# Patient Record
Sex: Female | Born: 1935
Health system: Southern US, Community
[De-identification: ages and names within clinical notes are randomized; demographics above are authoritative.]

## PROBLEM LIST (undated history)

## (undated) DIAGNOSIS — I10 Essential (primary) hypertension: Secondary | ICD-10-CM

## (undated) DIAGNOSIS — E079 Disorder of thyroid, unspecified: Secondary | ICD-10-CM

## (undated) HISTORY — PX: ABDOMINAL HYSTERECTOMY: SHX81

## (undated) HISTORY — PX: BREAST EXCISIONAL BIOPSY: SUR124

---

## 1999-12-31 ENCOUNTER — Encounter: Admission: RE | Admit: 1999-12-31 | Discharge: 1999-12-31 | Payer: Self-pay | Admitting: Internal Medicine

## 1999-12-31 ENCOUNTER — Encounter: Payer: Self-pay | Admitting: Internal Medicine

## 2000-12-11 ENCOUNTER — Encounter: Payer: Self-pay | Admitting: Internal Medicine

## 2000-12-11 ENCOUNTER — Encounter: Admission: RE | Admit: 2000-12-11 | Discharge: 2000-12-11 | Payer: Self-pay | Admitting: Internal Medicine

## 2000-12-21 ENCOUNTER — Other Ambulatory Visit: Admission: RE | Admit: 2000-12-21 | Discharge: 2000-12-21 | Payer: Self-pay | Admitting: Internal Medicine

## 2001-12-13 ENCOUNTER — Encounter: Payer: Self-pay | Admitting: Internal Medicine

## 2001-12-13 ENCOUNTER — Encounter: Admission: RE | Admit: 2001-12-13 | Discharge: 2001-12-13 | Payer: Self-pay | Admitting: Internal Medicine

## 2002-12-17 ENCOUNTER — Encounter: Admission: RE | Admit: 2002-12-17 | Discharge: 2002-12-17 | Payer: Self-pay | Admitting: Internal Medicine

## 2002-12-17 ENCOUNTER — Encounter: Payer: Self-pay | Admitting: Internal Medicine

## 2003-04-19 ENCOUNTER — Emergency Department (HOSPITAL_COMMUNITY): Admission: EM | Admit: 2003-04-19 | Discharge: 2003-04-19 | Payer: Self-pay | Admitting: Emergency Medicine

## 2003-09-08 ENCOUNTER — Encounter (INDEPENDENT_AMBULATORY_CARE_PROVIDER_SITE_OTHER): Payer: Self-pay | Admitting: Specialist

## 2003-09-08 ENCOUNTER — Ambulatory Visit (HOSPITAL_COMMUNITY): Admission: RE | Admit: 2003-09-08 | Discharge: 2003-09-08 | Payer: Self-pay | Admitting: *Deleted

## 2004-03-19 ENCOUNTER — Ambulatory Visit (HOSPITAL_COMMUNITY): Admission: RE | Admit: 2004-03-19 | Discharge: 2004-03-19 | Payer: Self-pay | Admitting: Internal Medicine

## 2005-03-28 ENCOUNTER — Ambulatory Visit (HOSPITAL_COMMUNITY): Admission: RE | Admit: 2005-03-28 | Discharge: 2005-03-28 | Payer: Self-pay | Admitting: Internal Medicine

## 2006-04-11 ENCOUNTER — Ambulatory Visit (HOSPITAL_COMMUNITY): Admission: RE | Admit: 2006-04-11 | Discharge: 2006-04-11 | Payer: Self-pay | Admitting: Internal Medicine

## 2007-04-16 ENCOUNTER — Ambulatory Visit (HOSPITAL_COMMUNITY): Admission: RE | Admit: 2007-04-16 | Discharge: 2007-04-16 | Payer: Self-pay | Admitting: Internal Medicine

## 2007-04-20 ENCOUNTER — Encounter: Admission: RE | Admit: 2007-04-20 | Discharge: 2007-04-20 | Payer: Self-pay | Admitting: Internal Medicine

## 2007-04-30 ENCOUNTER — Encounter: Admission: RE | Admit: 2007-04-30 | Discharge: 2007-04-30 | Payer: Self-pay | Admitting: Internal Medicine

## 2008-04-21 ENCOUNTER — Ambulatory Visit (HOSPITAL_COMMUNITY): Admission: RE | Admit: 2008-04-21 | Discharge: 2008-04-21 | Payer: Self-pay | Admitting: Internal Medicine

## 2008-12-01 IMAGING — US US EXTREM LOW VENOUS*L*
1 series · 14 of 24 positions shown · non-contrast
Comparison: None.

CLINICAL DATA: Left leg pain and swelling.  Question DVT.
 LEFT LOWER EXTREMITY VENOUS DOPPLER ULTRASOUND:
TECHNIQUE: Gray-scale sonography with compression as well as color and duplex Doppler ultrasound were performed to evaluate the deep venous system from the level of the common femoral vein through the popliteal and proximal calf veins.

[Series 1: unknown · 14 of 25 slices shown]
[im 1/25]
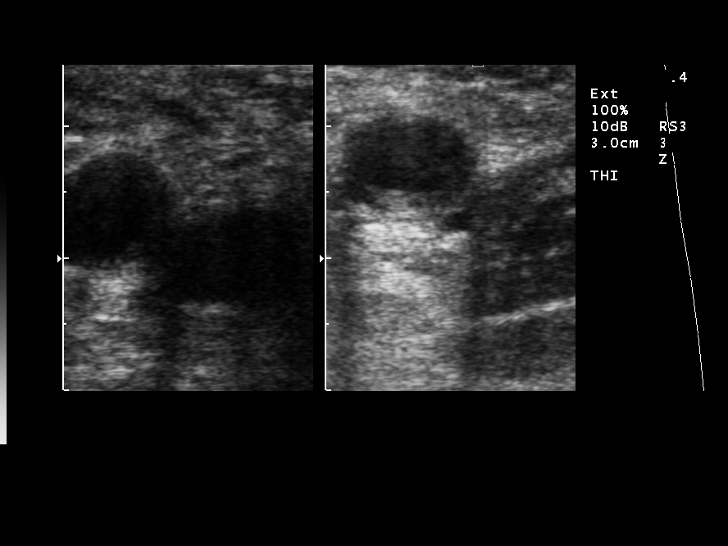
[im 3/25]
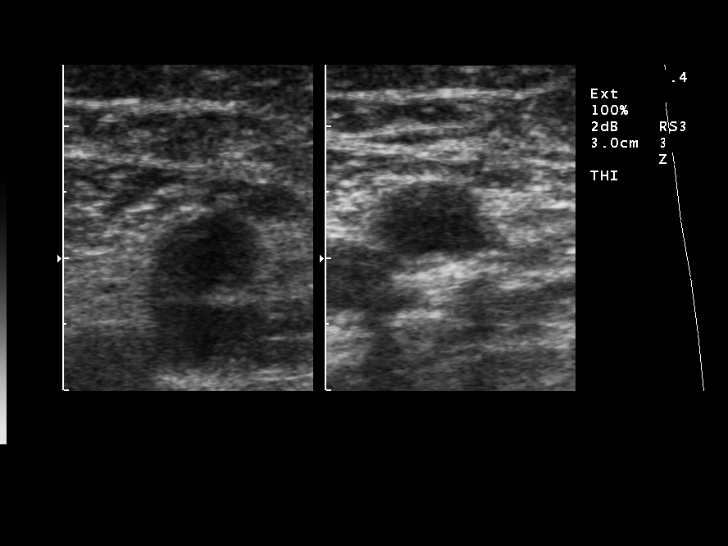
[im 5/25]
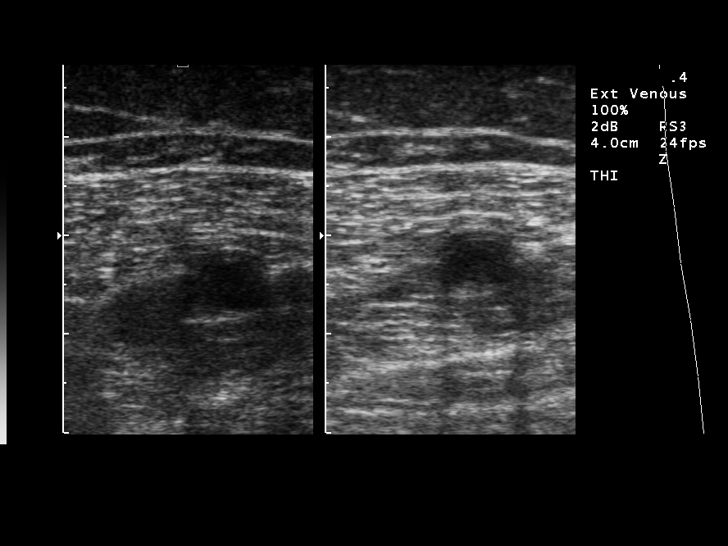
[im 7/25]
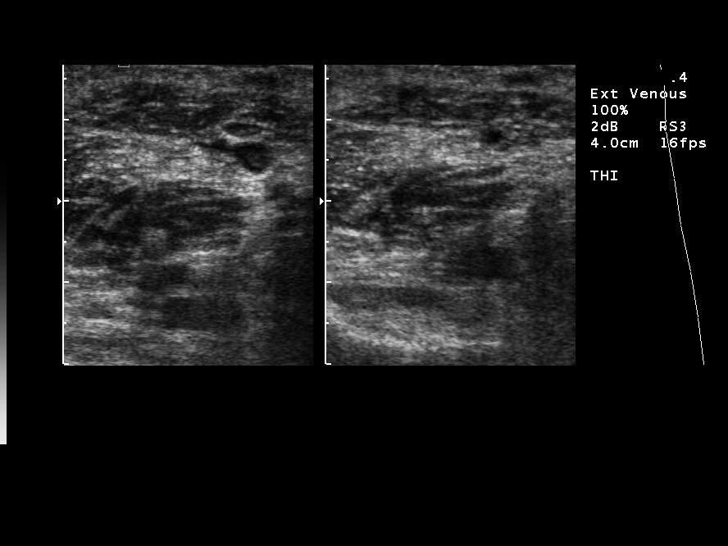
[im 8/25]
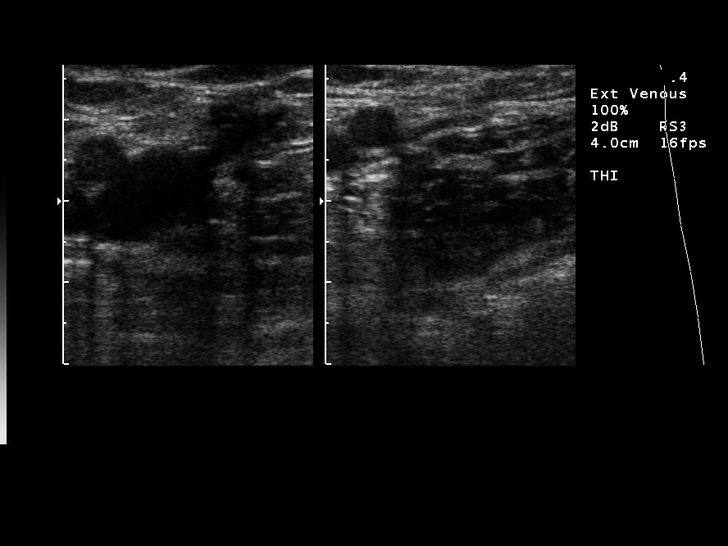
[im 10/25]
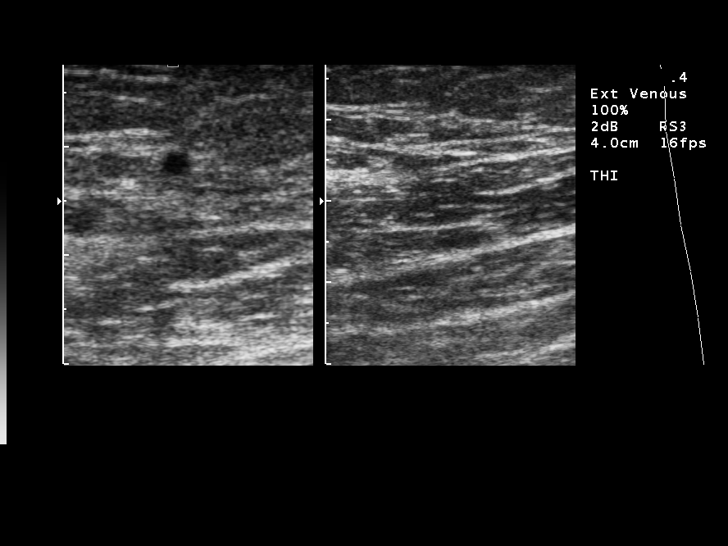
[im 12/25]
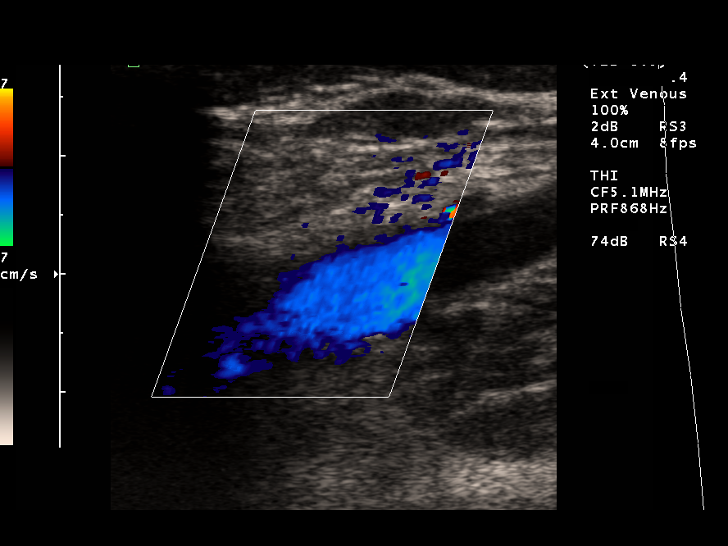
[im 13/25]
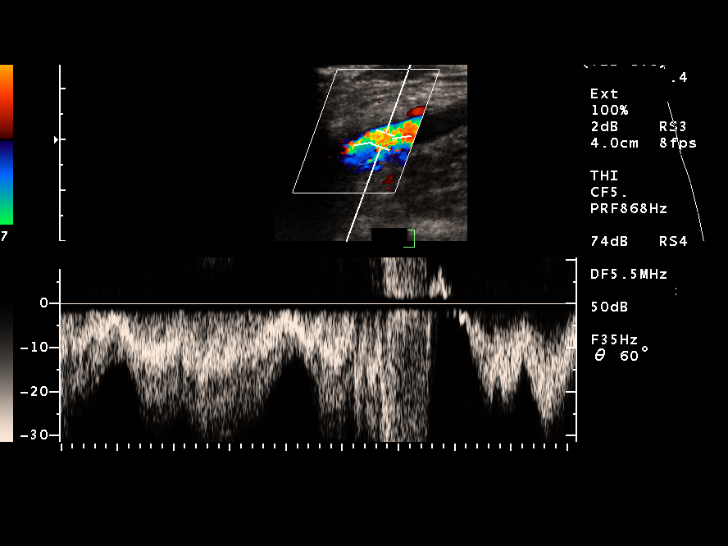
[im 15/25]
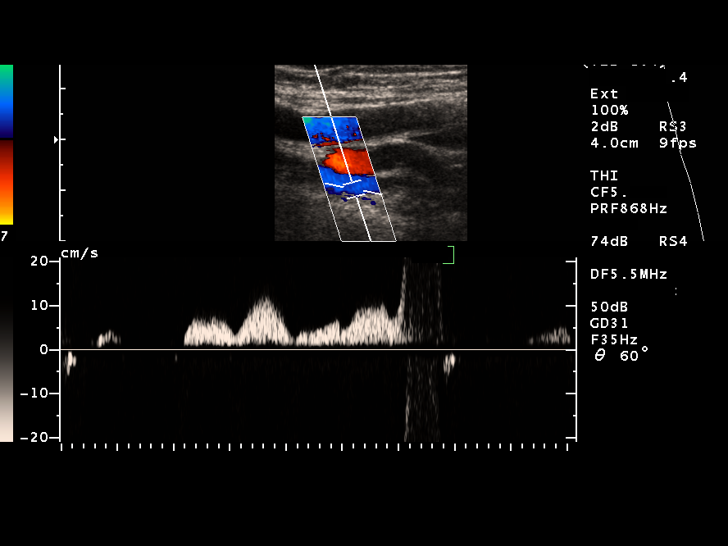
[im 17/25]
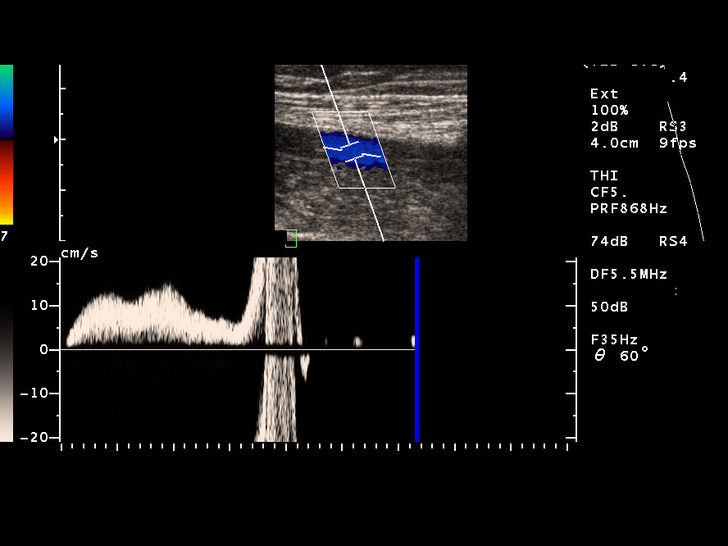
[im 19/25]
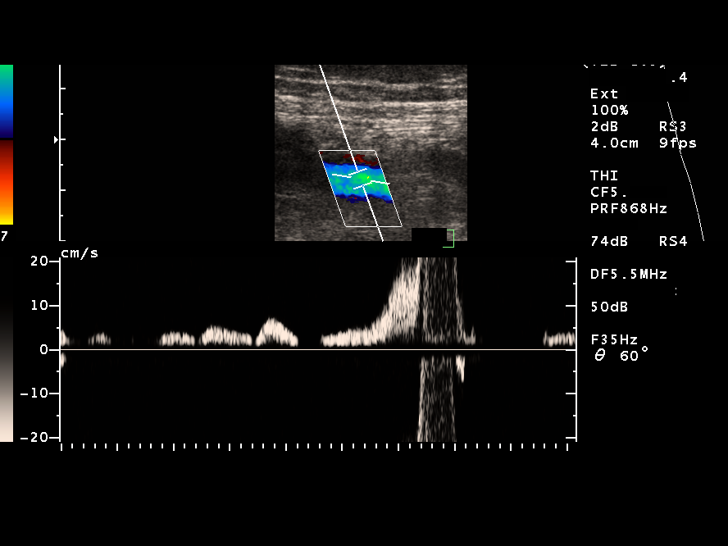
[im 20/25]
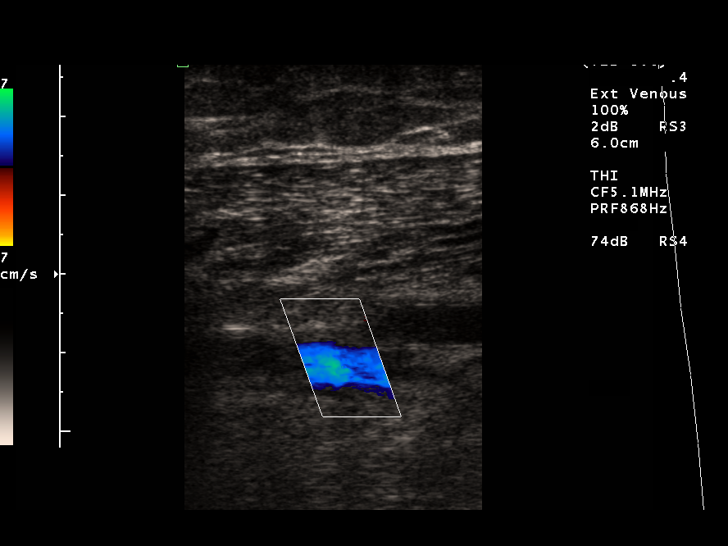
[im 22/25]
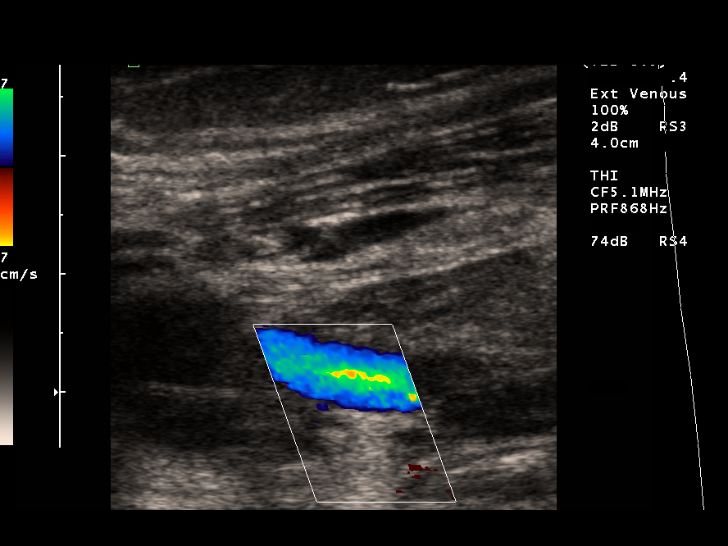
[im 25/25]
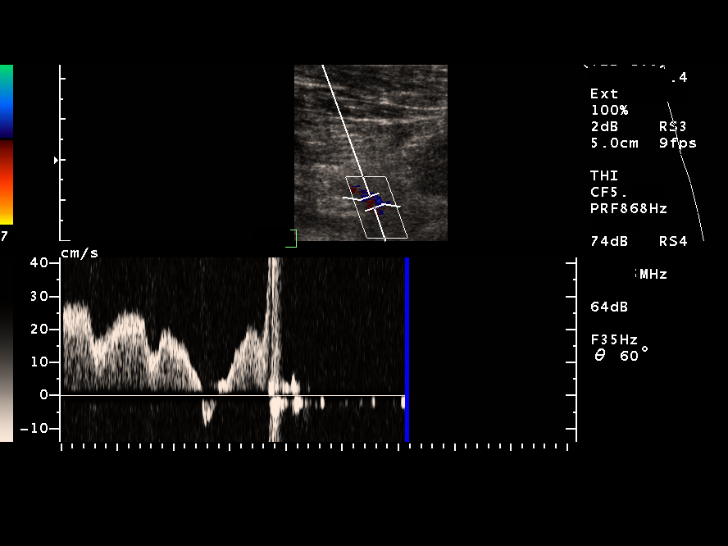

[14 of 24 positions shown; findings below may reference images not displayed]

FINDINGS: There is normal flow, compressibility, and augmentation in the left common femoral, superficial femoral, and popliteal veins.
IMPRESSION: No DVT.

## 2009-01-10 ENCOUNTER — Emergency Department (HOSPITAL_COMMUNITY): Admission: EM | Admit: 2009-01-10 | Discharge: 2009-01-10 | Payer: Self-pay | Admitting: Emergency Medicine

## 2009-04-22 ENCOUNTER — Ambulatory Visit (HOSPITAL_COMMUNITY): Admission: RE | Admit: 2009-04-22 | Discharge: 2009-04-22 | Payer: Self-pay | Admitting: Internal Medicine

## 2010-04-23 ENCOUNTER — Ambulatory Visit (HOSPITAL_COMMUNITY)
Admission: RE | Admit: 2010-04-23 | Discharge: 2010-04-23 | Payer: Self-pay | Source: Home / Self Care | Admitting: Internal Medicine

## 2010-06-27 ENCOUNTER — Encounter: Payer: Self-pay | Admitting: Internal Medicine

## 2010-10-22 NOTE — Op Note (Signed)
NAME:  Christy Buck, Christy Buck                        ACCOUNT NO.:  [id number]   MEDICAL RECORD NO.:  [id number]                   PATIENT TYPE:  AMB   LOCATION:  ENDO                                 FACILITY:  [facility]   PHYSICIAN:  Georgiana Spinner, M.D.                 DATE OF BIRTH:  1929-05-10   DATE OF PROCEDURE:  DATE OF DISCHARGE:                                 OPERATIVE REPORT   PROCEDURE:  Upper endoscopy.   INDICATIONS:  GERD.   ANESTHESIA:  Demerol 60 mg, Versed 5 mg.   DESCRIPTION OF PROCEDURE:  With the patient mildly sedated in the left  lateral decubitus position, the Olympus videoscopic endoscope was inserted  in the mouth and passed under direct vision through the esophagus into the  stomach, and through the pylorus to the second portion of the duodenum which  was photographed.  From this point, the endoscope was slowly withdrawn  taking circumferential views of the duodenal mucosa, stopping in the  duodenal bulb and photographed that, what appeared to be a normal bulb, and  in the antrum where diffuse mild erythema was seen and photographed and  biopsied.  Endoscope was then placed in retroflexion and viewed the stomach  from below.  The endoscope was then straightened, withdrawn, taking  circumferential views of the remaining gastric and esophageal mucosa.  The  patient's vital signs and pulse oximetry remained stable.  The patient  tolerated the procedure well without apparent complication.   FINDINGS:  Erythema in the antrum; otherwise unremarkable exam.   PLAN:  Await biopsy report.  The patient will call me for results and follow  up with me as an outpatient.  Proceed to colonoscopy as planned.                                               Georgiana Spinner, M.D.    GMO/MEDQ  D:  09/08/2003  T:  09/08/2003  Job:  [id number]

## 2010-10-22 NOTE — Op Note (Signed)
NAME:  Christy Buck, Christy Buck                        ACCOUNT NO.:  [id number]   MEDICAL RECORD NO.:  [id number]                   PATIENT TYPE:  AMB   LOCATION:  ENDO                                 FACILITY:  [facility]   PHYSICIAN:  Georgiana Spinner, M.D.                 DATE OF BIRTH:  [birth year]   DATE OF PROCEDURE:  09/08/2003  DATE OF DISCHARGE:                                 OPERATIVE REPORT   PROCEDURE:  Colonoscopy.   INDICATIONS:  Colon polyps.   ANESTHESIA:  1. Demerol 20 m  2. Versed 2 mg.   DESCRIPTION OF PROCEDURE:  With the patient mildly sedated in the left  lateral decubitus position, the Olympus videoscopic colonoscope was inserted  into the rectum  and passed under direct vision to the cecum -- identified  by the ileocecal valve and appendiceal orifice, both of which are  photographed.  From this point the colonoscope was slowly withdrawn, taking  circumferential views of the remaining colonic mucosa and stopping at  approximately 25 cm from the anal verge.  At this point a poly was seen,  photographed, and removed using snare cautery technique with setting of  20/20 blended current.  The tissue was retrieved for pathology.  The  endoscope was withdrawn through the rectum, which appeared normal in direct  and showed hemorrhoids in retroflexed view.  The endoscope was then  straightened and withdrawn.  The patient's vital signs and pulse oximetry  remained stable.  The patient tolerated the procedure well and all without  apparent complications.   FINDINGS:  1. Polyp at 25 cm.  2. Internal hemorrhoids.  3. Otherwise unremarkable examination.   PLAN:  Await biopsy report.  The patient will call me for results and follow  up with me as an outpatient.                                               Georgiana Spinner, M.D.    GMO/MEDQ  D:  09/08/2003  T:  09/08/2003  Job:  [id number]

## 2011-05-03 ENCOUNTER — Other Ambulatory Visit (HOSPITAL_COMMUNITY): Payer: Self-pay | Admitting: Internal Medicine

## 2011-05-03 DIAGNOSIS — Z1231 Encounter for screening mammogram for malignant neoplasm of breast: Secondary | ICD-10-CM

## 2011-06-09 ENCOUNTER — Ambulatory Visit (HOSPITAL_COMMUNITY)
Admission: RE | Admit: 2011-06-09 | Discharge: 2011-06-09 | Disposition: A | Discharge status: Home / Self Care | Payer: Medicare Other | Source: Ambulatory Visit | Attending: Internal Medicine | Admitting: Internal Medicine

## 2011-06-09 DIAGNOSIS — Z1231 Encounter for screening mammogram for malignant neoplasm of breast: Secondary | ICD-10-CM | POA: Diagnosis not present

## 2011-06-16 ENCOUNTER — Other Ambulatory Visit: Payer: Self-pay | Admitting: Internal Medicine

## 2011-06-16 DIAGNOSIS — R928 Other abnormal and inconclusive findings on diagnostic imaging of breast: Secondary | ICD-10-CM

## 2011-06-17 DIAGNOSIS — L03039 Cellulitis of unspecified toe: Secondary | ICD-10-CM | POA: Diagnosis not present

## 2011-06-22 DIAGNOSIS — E039 Hypothyroidism, unspecified: Secondary | ICD-10-CM | POA: Diagnosis not present

## 2011-06-22 DIAGNOSIS — I10 Essential (primary) hypertension: Secondary | ICD-10-CM | POA: Diagnosis not present

## 2011-06-22 DIAGNOSIS — M159 Polyosteoarthritis, unspecified: Secondary | ICD-10-CM | POA: Diagnosis not present

## 2011-06-22 DIAGNOSIS — R5381 Other malaise: Secondary | ICD-10-CM | POA: Diagnosis not present

## 2011-06-22 DIAGNOSIS — R5383 Other fatigue: Secondary | ICD-10-CM | POA: Diagnosis not present

## 2011-06-24 ENCOUNTER — Ambulatory Visit
Admission: RE | Admit: 2011-06-24 | Discharge: 2011-06-24 | Disposition: A | Discharge status: Home / Self Care | Payer: Medicare Other | Source: Ambulatory Visit | Attending: Internal Medicine | Admitting: Internal Medicine

## 2011-06-24 ENCOUNTER — Ambulatory Visit
Admission: RE | Admit: 2011-06-24 | Discharge: 2011-06-24 | Disposition: A | Discharge status: Home / Self Care | Payer: Federal, State, Local not specified - PPO | Source: Ambulatory Visit | Attending: Internal Medicine | Admitting: Internal Medicine

## 2011-06-24 DIAGNOSIS — R928 Other abnormal and inconclusive findings on diagnostic imaging of breast: Secondary | ICD-10-CM

## 2011-06-24 DIAGNOSIS — N6009 Solitary cyst of unspecified breast: Secondary | ICD-10-CM | POA: Diagnosis not present

## 2011-07-13 DIAGNOSIS — H524 Presbyopia: Secondary | ICD-10-CM | POA: Diagnosis not present

## 2011-07-13 DIAGNOSIS — H04129 Dry eye syndrome of unspecified lacrimal gland: Secondary | ICD-10-CM | POA: Diagnosis not present

## 2011-07-13 DIAGNOSIS — H1045 Other chronic allergic conjunctivitis: Secondary | ICD-10-CM | POA: Diagnosis not present

## 2011-07-13 DIAGNOSIS — H409 Unspecified glaucoma: Secondary | ICD-10-CM | POA: Diagnosis not present

## 2011-07-13 DIAGNOSIS — H4011X0 Primary open-angle glaucoma, stage unspecified: Secondary | ICD-10-CM | POA: Diagnosis not present

## 2011-09-06 DIAGNOSIS — L84 Corns and callosities: Secondary | ICD-10-CM | POA: Diagnosis not present

## 2011-12-09 DIAGNOSIS — H4011X0 Primary open-angle glaucoma, stage unspecified: Secondary | ICD-10-CM | POA: Diagnosis not present

## 2011-12-09 DIAGNOSIS — H2510 Age-related nuclear cataract, unspecified eye: Secondary | ICD-10-CM | POA: Diagnosis not present

## 2011-12-09 DIAGNOSIS — H409 Unspecified glaucoma: Secondary | ICD-10-CM | POA: Diagnosis not present

## 2011-12-09 DIAGNOSIS — H40039 Anatomical narrow angle, unspecified eye: Secondary | ICD-10-CM | POA: Diagnosis not present

## 2011-12-09 DIAGNOSIS — H40019 Open angle with borderline findings, low risk, unspecified eye: Secondary | ICD-10-CM | POA: Diagnosis not present

## 2011-12-12 ENCOUNTER — Other Ambulatory Visit: Payer: Self-pay | Admitting: Internal Medicine

## 2011-12-12 DIAGNOSIS — N63 Unspecified lump in unspecified breast: Secondary | ICD-10-CM

## 2011-12-16 ENCOUNTER — Ambulatory Visit
Admission: RE | Admit: 2011-12-16 | Discharge: 2011-12-16 | Disposition: A | Discharge status: Home / Self Care | Payer: Medicare Other | Source: Ambulatory Visit | Attending: Internal Medicine | Admitting: Internal Medicine

## 2011-12-16 DIAGNOSIS — N63 Unspecified lump in unspecified breast: Secondary | ICD-10-CM

## 2011-12-16 DIAGNOSIS — R928 Other abnormal and inconclusive findings on diagnostic imaging of breast: Secondary | ICD-10-CM | POA: Diagnosis not present

## 2011-12-21 DIAGNOSIS — E039 Hypothyroidism, unspecified: Secondary | ICD-10-CM | POA: Diagnosis not present

## 2011-12-21 DIAGNOSIS — Z0000 Encounter for general adult medical examination without abnormal findings: Secondary | ICD-10-CM | POA: Diagnosis not present

## 2011-12-21 DIAGNOSIS — I10 Essential (primary) hypertension: Secondary | ICD-10-CM | POA: Diagnosis not present

## 2011-12-21 DIAGNOSIS — M159 Polyosteoarthritis, unspecified: Secondary | ICD-10-CM | POA: Diagnosis not present

## 2011-12-21 DIAGNOSIS — R5381 Other malaise: Secondary | ICD-10-CM | POA: Diagnosis not present

## 2011-12-28 DIAGNOSIS — E039 Hypothyroidism, unspecified: Secondary | ICD-10-CM | POA: Diagnosis not present

## 2011-12-28 DIAGNOSIS — R5383 Other fatigue: Secondary | ICD-10-CM | POA: Diagnosis not present

## 2011-12-28 DIAGNOSIS — I10 Essential (primary) hypertension: Secondary | ICD-10-CM | POA: Diagnosis not present

## 2011-12-28 DIAGNOSIS — H908 Mixed conductive and sensorineural hearing loss, unspecified: Secondary | ICD-10-CM | POA: Diagnosis not present

## 2011-12-28 DIAGNOSIS — M159 Polyosteoarthritis, unspecified: Secondary | ICD-10-CM | POA: Diagnosis not present

## 2012-03-12 DIAGNOSIS — H402290 Chronic angle-closure glaucoma, unspecified eye, stage unspecified: Secondary | ICD-10-CM | POA: Diagnosis not present

## 2012-03-12 DIAGNOSIS — H4011X0 Primary open-angle glaucoma, stage unspecified: Secondary | ICD-10-CM | POA: Diagnosis not present

## 2012-03-12 DIAGNOSIS — H409 Unspecified glaucoma: Secondary | ICD-10-CM | POA: Diagnosis not present

## 2012-03-12 DIAGNOSIS — H04129 Dry eye syndrome of unspecified lacrimal gland: Secondary | ICD-10-CM | POA: Diagnosis not present

## 2012-03-23 DIAGNOSIS — H409 Unspecified glaucoma: Secondary | ICD-10-CM | POA: Diagnosis not present

## 2012-03-23 DIAGNOSIS — H2510 Age-related nuclear cataract, unspecified eye: Secondary | ICD-10-CM | POA: Diagnosis not present

## 2012-03-23 DIAGNOSIS — H402290 Chronic angle-closure glaucoma, unspecified eye, stage unspecified: Secondary | ICD-10-CM | POA: Diagnosis not present

## 2012-03-30 DIAGNOSIS — Z23 Encounter for immunization: Secondary | ICD-10-CM | POA: Diagnosis not present

## 2012-04-12 DIAGNOSIS — H409 Unspecified glaucoma: Secondary | ICD-10-CM | POA: Diagnosis not present

## 2012-04-12 DIAGNOSIS — H402290 Chronic angle-closure glaucoma, unspecified eye, stage unspecified: Secondary | ICD-10-CM | POA: Diagnosis not present

## 2012-05-10 DIAGNOSIS — H409 Unspecified glaucoma: Secondary | ICD-10-CM | POA: Diagnosis not present

## 2012-05-10 DIAGNOSIS — H402290 Chronic angle-closure glaucoma, unspecified eye, stage unspecified: Secondary | ICD-10-CM | POA: Diagnosis not present

## 2012-06-13 ENCOUNTER — Other Ambulatory Visit: Payer: Self-pay | Admitting: Internal Medicine

## 2012-06-13 DIAGNOSIS — N63 Unspecified lump in unspecified breast: Secondary | ICD-10-CM

## 2012-06-22 ENCOUNTER — Other Ambulatory Visit: Payer: Self-pay | Admitting: Internal Medicine

## 2012-06-22 ENCOUNTER — Ambulatory Visit
Admission: RE | Admit: 2012-06-22 | Discharge: 2012-06-22 | Disposition: A | Discharge status: Home / Self Care | Payer: Federal, State, Local not specified - PPO | Source: Ambulatory Visit | Attending: Internal Medicine | Admitting: Internal Medicine

## 2012-06-22 ENCOUNTER — Ambulatory Visit
Admission: RE | Admit: 2012-06-22 | Discharge: 2012-06-22 | Disposition: A | Discharge status: Home / Self Care | Payer: Medicare Other | Source: Ambulatory Visit | Attending: Internal Medicine | Admitting: Internal Medicine

## 2012-06-22 DIAGNOSIS — N63 Unspecified lump in unspecified breast: Secondary | ICD-10-CM

## 2012-06-22 DIAGNOSIS — N6009 Solitary cyst of unspecified breast: Secondary | ICD-10-CM | POA: Diagnosis not present

## 2012-06-29 DIAGNOSIS — R5383 Other fatigue: Secondary | ICD-10-CM | POA: Diagnosis not present

## 2012-06-29 DIAGNOSIS — I10 Essential (primary) hypertension: Secondary | ICD-10-CM | POA: Diagnosis not present

## 2012-06-29 DIAGNOSIS — E039 Hypothyroidism, unspecified: Secondary | ICD-10-CM | POA: Diagnosis not present

## 2012-06-29 DIAGNOSIS — M159 Polyosteoarthritis, unspecified: Secondary | ICD-10-CM | POA: Diagnosis not present

## 2012-06-29 DIAGNOSIS — R5381 Other malaise: Secondary | ICD-10-CM | POA: Diagnosis not present

## 2012-07-06 DIAGNOSIS — E039 Hypothyroidism, unspecified: Secondary | ICD-10-CM | POA: Diagnosis not present

## 2012-07-06 DIAGNOSIS — I10 Essential (primary) hypertension: Secondary | ICD-10-CM | POA: Diagnosis not present

## 2012-07-06 DIAGNOSIS — M159 Polyosteoarthritis, unspecified: Secondary | ICD-10-CM | POA: Diagnosis not present

## 2012-07-06 DIAGNOSIS — I498 Other specified cardiac arrhythmias: Secondary | ICD-10-CM | POA: Diagnosis not present

## 2012-07-27 DIAGNOSIS — H35379 Puckering of macula, unspecified eye: Secondary | ICD-10-CM | POA: Diagnosis not present

## 2012-07-27 DIAGNOSIS — H04129 Dry eye syndrome of unspecified lacrimal gland: Secondary | ICD-10-CM | POA: Diagnosis not present

## 2012-07-27 DIAGNOSIS — H409 Unspecified glaucoma: Secondary | ICD-10-CM | POA: Diagnosis not present

## 2012-07-27 DIAGNOSIS — H2510 Age-related nuclear cataract, unspecified eye: Secondary | ICD-10-CM | POA: Diagnosis not present

## 2012-07-27 DIAGNOSIS — H4011X0 Primary open-angle glaucoma, stage unspecified: Secondary | ICD-10-CM | POA: Diagnosis not present

## 2012-12-12 DIAGNOSIS — H409 Unspecified glaucoma: Secondary | ICD-10-CM | POA: Diagnosis not present

## 2012-12-12 DIAGNOSIS — H402290 Chronic angle-closure glaucoma, unspecified eye, stage unspecified: Secondary | ICD-10-CM | POA: Diagnosis not present

## 2012-12-12 DIAGNOSIS — H04129 Dry eye syndrome of unspecified lacrimal gland: Secondary | ICD-10-CM | POA: Diagnosis not present

## 2013-01-25 DIAGNOSIS — E039 Hypothyroidism, unspecified: Secondary | ICD-10-CM | POA: Diagnosis not present

## 2013-01-25 DIAGNOSIS — Z0000 Encounter for general adult medical examination without abnormal findings: Secondary | ICD-10-CM | POA: Diagnosis not present

## 2013-01-25 DIAGNOSIS — I10 Essential (primary) hypertension: Secondary | ICD-10-CM | POA: Diagnosis not present

## 2013-01-25 DIAGNOSIS — Z1331 Encounter for screening for depression: Secondary | ICD-10-CM | POA: Diagnosis not present

## 2013-01-25 DIAGNOSIS — M159 Polyosteoarthritis, unspecified: Secondary | ICD-10-CM | POA: Diagnosis not present

## 2013-02-01 DIAGNOSIS — I498 Other specified cardiac arrhythmias: Secondary | ICD-10-CM | POA: Diagnosis not present

## 2013-02-01 DIAGNOSIS — N951 Menopausal and female climacteric states: Secondary | ICD-10-CM | POA: Diagnosis not present

## 2013-02-01 DIAGNOSIS — I10 Essential (primary) hypertension: Secondary | ICD-10-CM | POA: Diagnosis not present

## 2013-02-01 DIAGNOSIS — E039 Hypothyroidism, unspecified: Secondary | ICD-10-CM | POA: Diagnosis not present

## 2013-02-05 DIAGNOSIS — M810 Age-related osteoporosis without current pathological fracture: Secondary | ICD-10-CM | POA: Diagnosis not present

## 2013-03-22 DIAGNOSIS — Z23 Encounter for immunization: Secondary | ICD-10-CM | POA: Diagnosis not present

## 2013-07-19 ENCOUNTER — Other Ambulatory Visit: Payer: Self-pay | Admitting: Internal Medicine

## 2013-07-19 DIAGNOSIS — N63 Unspecified lump in unspecified breast: Secondary | ICD-10-CM

## 2013-07-31 ENCOUNTER — Ambulatory Visit
Admission: RE | Admit: 2013-07-31 | Discharge: 2013-07-31 | Disposition: A | Discharge status: Home / Self Care | Payer: Medicare Other | Source: Ambulatory Visit | Attending: Internal Medicine | Admitting: Internal Medicine

## 2013-07-31 DIAGNOSIS — N63 Unspecified lump in unspecified breast: Secondary | ICD-10-CM

## 2013-07-31 DIAGNOSIS — R928 Other abnormal and inconclusive findings on diagnostic imaging of breast: Secondary | ICD-10-CM | POA: Diagnosis not present

## 2013-08-08 DIAGNOSIS — H04129 Dry eye syndrome of unspecified lacrimal gland: Secondary | ICD-10-CM | POA: Diagnosis not present

## 2013-08-08 DIAGNOSIS — H524 Presbyopia: Secondary | ICD-10-CM | POA: Diagnosis not present

## 2013-08-08 DIAGNOSIS — H409 Unspecified glaucoma: Secondary | ICD-10-CM | POA: Diagnosis not present

## 2013-08-08 DIAGNOSIS — H02409 Unspecified ptosis of unspecified eyelid: Secondary | ICD-10-CM | POA: Diagnosis not present

## 2013-08-08 DIAGNOSIS — H402290 Chronic angle-closure glaucoma, unspecified eye, stage unspecified: Secondary | ICD-10-CM | POA: Diagnosis not present

## 2013-08-16 DIAGNOSIS — I10 Essential (primary) hypertension: Secondary | ICD-10-CM | POA: Diagnosis not present

## 2013-08-23 DIAGNOSIS — E039 Hypothyroidism, unspecified: Secondary | ICD-10-CM | POA: Diagnosis not present

## 2013-08-23 DIAGNOSIS — I10 Essential (primary) hypertension: Secondary | ICD-10-CM | POA: Diagnosis not present

## 2013-08-23 DIAGNOSIS — M159 Polyosteoarthritis, unspecified: Secondary | ICD-10-CM | POA: Diagnosis not present

## 2013-10-28 ENCOUNTER — Emergency Department (HOSPITAL_COMMUNITY)
Admission: EM | Admit: 2013-10-28 | Discharge: 2013-10-28 | Disposition: A | Discharge status: Home / Self Care | Payer: Medicare Other | Attending: Emergency Medicine | Admitting: Emergency Medicine

## 2013-10-28 ENCOUNTER — Encounter (HOSPITAL_COMMUNITY): Payer: Self-pay | Admitting: Emergency Medicine

## 2013-10-28 DIAGNOSIS — Z8639 Personal history of other endocrine, nutritional and metabolic disease: Secondary | ICD-10-CM | POA: Insufficient documentation

## 2013-10-28 DIAGNOSIS — I10 Essential (primary) hypertension: Secondary | ICD-10-CM

## 2013-10-28 DIAGNOSIS — Z862 Personal history of diseases of the blood and blood-forming organs and certain disorders involving the immune mechanism: Secondary | ICD-10-CM | POA: Insufficient documentation

## 2013-10-28 HISTORY — DX: Essential (primary) hypertension: I10

## 2013-10-28 HISTORY — DX: Disorder of thyroid, unspecified: E07.9

## 2013-10-28 NOTE — ED Notes (Signed)
Pt brought to room; pt getting undressed and into a gown at this time; Darrel, EMT aware

## 2013-10-28 NOTE — Discharge Instructions (Signed)
Arterial Hypertension Call Dr. Everlena Cooper office tomorrow to arrange to get your blood pressure recheck in the office in a week Arterial hypertension (high blood pressure) is a condition of elevated pressure in your blood vessels. Hypertension over a long period of time is a risk factor for strokes, heart attacks, and heart failure. It is also the leading cause of kidney (renal) failure.  CAUSES   In Adults -- Over 90% of all hypertension has no known cause. This is called essential or primary hypertension. In the other 10% of people with hypertension, the increase in blood pressure is caused by another disorder. This is called secondary hypertension. Important causes of secondary hypertension are:  Heavy alcohol use.  Obstructive sleep apnea.  Hyperaldosterosim (Conn's syndrome).  Steroid use.  Chronic kidney failure.  Hyperparathyroidism.  Medications.  Renal artery stenosis.  Pheochromocytoma.  Cushing's disease.  Coarctation of the aorta.  Scleroderma renal crisis.  Licorice (in excessive amounts).  Drugs (cocaine, methamphetamine). Your caregiver can explain any items above that apply to you.  In Children -- Secondary hypertension is more common and should always be considered.  Pregnancy -- Few women of childbearing age have high blood pressure. However, up to 10% of them develop hypertension of pregnancy. Generally, this will not harm the woman. It may be a sign of 3 complications of pregnancy: preeclampsia, HELLP syndrome, and eclampsia. Follow up and control with medication is necessary. SYMPTOMS   This condition normally does not produce any noticeable symptoms. It is usually found during a routine exam.  Malignant hypertension is a late problem of high blood pressure. It may have the following symptoms:  Headaches.  Blurred vision.  End-organ damage (this means your kidneys, heart, lungs, and other organs are being damaged).  Stressful situations can  increase the blood pressure. If a person with normal blood pressure has their blood pressure go up while being seen by their caregiver, this is often termed "white coat hypertension." Its importance is not known. It may be related with eventually developing hypertension or complications of hypertension.  Hypertension is often confused with mental tension, stress, and anxiety. DIAGNOSIS  The diagnosis is made by 3 separate blood pressure measurements. They are taken at least 1 week apart from each other. If there is organ damage from hypertension, the diagnosis may be made without repeat measurements. Hypertension is usually identified by having blood pressure readings:  Above 140/90 mmHg measured in both arms, at 3 separate times, over a couple weeks.  Over 130/80 mmHg should be considered a risk factor and may require treatment in patients with diabetes. Blood pressure readings over 120/80 mmHg are called "pre-hypertension" even in non-diabetic patients. To get a true blood pressure measurement, use the following guidelines. Be aware of the factors that can alter blood pressure readings.  Take measurements at least 1 hour after caffeine.  Take measurements 30 minutes after smoking and without any stress. This is another reason to quit smoking  it raises your blood pressure.  Use a proper cuff size. Ask your caregiver if you are not sure about your cuff size.  Most home blood pressure cuffs are automatic. They will measure systolic and diastolic pressures. The systolic pressure is the pressure reading at the start of sounds. Diastolic pressure is the pressure at which the sounds disappear. If you are elderly, measure pressures in multiple postures. Try sitting, lying or standing.  Sit at rest for a minimum of 5 minutes before taking measurements.  You should not be on  any medications like decongestants. These are found in many cold medications.  Record your blood pressure readings and review  them with your caregiver. If you have hypertension:  Your caregiver may do tests to be sure you do not have secondary hypertension (see "causes" above).  Your caregiver may also look for signs of metabolic syndrome. This is also called Syndrome X or Insulin Resistance Syndrome. You may have this syndrome if you have type 2 diabetes, abdominal obesity, and abnormal blood lipids in addition to hypertension.  Your caregiver will take your medical and family history and perform a physical exam.  Diagnostic tests may include blood tests (for glucose, cholesterol, potassium, and kidney function), a urinalysis, or an EKG. Other tests may also be necessary depending on your condition. PREVENTION  There are important lifestyle issues that you can adopt to reduce your chance of developing hypertension:  Maintain a normal weight.  Limit the amount of salt (sodium) in your diet.  Exercise often.  Limit alcohol intake.  Get enough potassium in your diet. Discuss specific advice with your caregiver.  Follow a DASH diet (dietary approaches to stop hypertension). This diet is rich in fruits, vegetables, and low-fat dairy products, and avoids certain fats. PROGNOSIS  Essential hypertension cannot be cured. Lifestyle changes and medical treatment can lower blood pressure and reduce complications. The prognosis of secondary hypertension depends on the underlying cause. Many people whose hypertension is controlled with medicine or lifestyle changes can live a normal, healthy life.  RISKS AND COMPLICATIONS  While high blood pressure alone is not an illness, it often requires treatment due to its short- and long-term effects on many organs. Hypertension increases your risk for:  CVAs or strokes (cerebrovascular accident).  Heart failure due to chronically high blood pressure (hypertensive cardiomyopathy).  Heart attack (myocardial infarction).  Damage to the retina (hypertensive retinopathy).  Kidney  failure (hypertensive nephropathy). Your caregiver can explain list items above that apply to you. Treatment of hypertension can significantly reduce the risk of complications. TREATMENT   For overweight patients, weight loss and regular exercise are recommended. Physical fitness lowers blood pressure.  Mild hypertension is usually treated with diet and exercise. A diet rich in fruits and vegetables, fat-free dairy products, and foods low in fat and salt (sodium) can help lower blood pressure. Decreasing salt intake decreases blood pressure in a 1/3 of people.  Stop smoking if you are a smoker. The steps above are highly effective in reducing blood pressure. While these actions are easy to suggest, they are difficult to achieve. Most patients with moderate or severe hypertension end up requiring medications to bring their blood pressure down to a normal level. There are several classes of medications for treatment. Blood pressure pills (antihypertensives) will lower blood pressure by their different actions. Lowering the blood pressure by 10 mmHg may decrease the risk of complications by as much as 25%. The goal of treatment is effective blood pressure control. This will reduce your risk for complications. Your caregiver will help you determine the best treatment for you according to your lifestyle. What is excellent treatment for one person, may not be for you. HOME CARE INSTRUCTIONS   Do not smoke.  Follow the lifestyle changes outlined in the "Prevention" section.  If you are on medications, follow the directions carefully. Blood pressure medications must be taken as prescribed. Skipping doses reduces their benefit. It also puts you at risk for problems.  Follow up with your caregiver, as directed.  If you are  asked to monitor your blood pressure at home, follow the guidelines in the "Diagnosis" section above. SEEK MEDICAL CARE IF:   You think you are having medication side effects.  You  have recurrent headaches or lightheadedness.  You have swelling in your ankles.  You have trouble with your vision. SEEK IMMEDIATE MEDICAL CARE IF:   You have sudden onset of chest pain or pressure, difficulty breathing, or other symptoms of a heart attack.  You have a severe headache.  You have symptoms of a stroke (such as sudden weakness, difficulty speaking, difficulty walking). MAKE SURE YOU:   Understand these instructions.  Will watch your condition.  Will get help right away if you are not doing well or get worse. Document Released: 05/23/2005 Document Revised: 08/15/2011 Document Reviewed: 12/21/2006 [facility] [employer] Patient Information 2014 [place], [place].

## 2013-10-28 NOTE — ED Notes (Signed)
[facility], checked her bp and it was elevated sbp >200. Pt reports feeling fine, states she take a bp medication and a fluid pill but hasnt been taking the fluid pill regularly. bp is 205/95 at triage.

## 2013-10-28 NOTE — ED Provider Notes (Signed)
CSN: [id number]     Arrival date & time 10/28/13  1346 History   First MD Initiated Contact with Patient 10/28/13 1505     Chief Complaint  Patient presents with  . Hypertension     (Consider location/radiation/quality/duration/timing/severity/associated sxs/prior Treatment) HPI She reports that she took her blood pressure on the automatic machine at the [facility] pharmacy today and it was noted to be greater than 200. She took her blood pressure after she got upset that they had not fill her prescription in a timely fashion. She remains asymptomatic. Denies headache denies chest pain denies abdominal pain no focal numbness or weakness. No treatment prior to coming here. He has not missed any doses of her medications. She did take her blood pressure medicine today Past Medical History  Diagnosis Date  . Hypertension   . Thyroid disease    History reviewed. No pertinent past surgical history. History reviewed. No pertinent family history. History  Substance Use Topics  . Smoking status: Not on file  . Smokeless tobacco: Not on file  . Alcohol Use: No   no tobacco no alcohol no drugs OB History   Grav Para Term Preterm Abortions TAB SAB Ect Mult Living                 Review of Systems  Constitutional: Negative.   HENT: Negative.   Respiratory: Negative.   Cardiovascular: Negative.   Gastrointestinal: Negative.   Musculoskeletal: Negative.   Skin: Negative.   Neurological: Negative.   Psychiatric/Behavioral: Negative.   All other systems reviewed and are negative.     Allergies  Review of patient's allergies indicates no known allergies.  Home Medications   Prior to Admission medications   Not on File   BP 205/95  Pulse 83  Temp(Src) 98.3 F (36.8 C) (Oral)  Resp 18  Ht 5\' 2"  (1.575 m)  Wt 141 lb (63.957 kg)  BMI 25.78 kg/m2  SpO2 100% Physical Exam  Nursing note and vitals reviewed. Constitutional: She appears well-developed and well-nourished.  HENT:  Head:  Normocephalic and atraumatic.  Eyes: Conjunctivae are normal. Pupils are equal, round, and reactive to light.  Neck: Neck supple. No tracheal deviation present. No thyromegaly present.  Cardiovascular: Normal rate and regular rhythm.   No murmur heard. Pulmonary/Chest: Effort normal and breath sounds normal.  Abdominal: Soft. Bowel sounds are normal. She exhibits no distension. There is no tenderness.  Musculoskeletal: Normal range of motion. She exhibits no edema and no tenderness.  Neurological: She is alert. Coordination normal.  Skin: Skin is warm and dry. No rash noted.  Psychiatric: She has a normal mood and affect.    ED Course  Procedures (including critical care time) Labs Review Labs Reviewed - No data to display  Imaging Review No results found.   EKG Interpretation   Date/Time:  Monday Oct 28 2013 14:20:42 EDT Ventricular Rate:  75 PR Interval:  246 QRS Duration: 72 QT Interval:  382 QTC Calculation: 426 R Axis:   -45 Text Interpretation:  Sinus rhythm with 1st degree A-V block with  Premature supraventricular complexes and with occasional Premature  ventricular complexes Left axis deviation Low voltage QRS Septal infarct ,  age undetermined Abnormal ECG No old tracing to compare Confirmed by  Ethelda Chick  MD, Saamiya Jeppsen [phone]) on 10/28/2013 3:25:57 PM      MDM   Final diagnoses:  None   No further treatment needed. Blood pressure recheck 1 week Diagnoses hypertension    Doug Sou, MD  10/28/13 1531 

## 2013-11-06 DIAGNOSIS — I10 Essential (primary) hypertension: Secondary | ICD-10-CM | POA: Diagnosis not present

## 2013-11-06 DIAGNOSIS — E039 Hypothyroidism, unspecified: Secondary | ICD-10-CM | POA: Diagnosis not present

## 2013-11-06 DIAGNOSIS — I498 Other specified cardiac arrhythmias: Secondary | ICD-10-CM | POA: Diagnosis not present

## 2013-11-06 DIAGNOSIS — M159 Polyosteoarthritis, unspecified: Secondary | ICD-10-CM | POA: Diagnosis not present

## 2013-12-04 DIAGNOSIS — M159 Polyosteoarthritis, unspecified: Secondary | ICD-10-CM | POA: Diagnosis not present

## 2013-12-04 DIAGNOSIS — I10 Essential (primary) hypertension: Secondary | ICD-10-CM | POA: Diagnosis not present

## 2013-12-04 DIAGNOSIS — E039 Hypothyroidism, unspecified: Secondary | ICD-10-CM | POA: Diagnosis not present

## 2014-01-23 DIAGNOSIS — H409 Unspecified glaucoma: Secondary | ICD-10-CM | POA: Diagnosis not present

## 2014-01-23 DIAGNOSIS — H402290 Chronic angle-closure glaucoma, unspecified eye, stage unspecified: Secondary | ICD-10-CM | POA: Diagnosis not present

## 2014-02-11 DIAGNOSIS — E039 Hypothyroidism, unspecified: Secondary | ICD-10-CM | POA: Diagnosis not present

## 2014-02-11 DIAGNOSIS — I10 Essential (primary) hypertension: Secondary | ICD-10-CM | POA: Diagnosis not present

## 2014-02-11 DIAGNOSIS — Z1331 Encounter for screening for depression: Secondary | ICD-10-CM | POA: Diagnosis not present

## 2014-02-11 DIAGNOSIS — Z0000 Encounter for general adult medical examination without abnormal findings: Secondary | ICD-10-CM | POA: Diagnosis not present

## 2014-02-18 DIAGNOSIS — I498 Other specified cardiac arrhythmias: Secondary | ICD-10-CM | POA: Diagnosis not present

## 2014-02-18 DIAGNOSIS — I10 Essential (primary) hypertension: Secondary | ICD-10-CM | POA: Diagnosis not present

## 2014-02-18 DIAGNOSIS — E039 Hypothyroidism, unspecified: Secondary | ICD-10-CM | POA: Diagnosis not present

## 2014-02-18 DIAGNOSIS — M159 Polyosteoarthritis, unspecified: Secondary | ICD-10-CM | POA: Diagnosis not present

## 2014-03-21 DIAGNOSIS — Z23 Encounter for immunization: Secondary | ICD-10-CM | POA: Diagnosis not present

## 2014-05-06 DIAGNOSIS — E871 Hypo-osmolality and hyponatremia: Secondary | ICD-10-CM | POA: Diagnosis not present

## 2014-05-06 DIAGNOSIS — I10 Essential (primary) hypertension: Secondary | ICD-10-CM | POA: Diagnosis not present

## 2014-05-13 DIAGNOSIS — E871 Hypo-osmolality and hyponatremia: Secondary | ICD-10-CM | POA: Diagnosis not present

## 2014-05-13 DIAGNOSIS — I10 Essential (primary) hypertension: Secondary | ICD-10-CM | POA: Diagnosis not present

## 2014-06-03 DIAGNOSIS — H402232 Chronic angle-closure glaucoma, bilateral, moderate stage: Secondary | ICD-10-CM | POA: Diagnosis not present

## 2014-06-26 ENCOUNTER — Other Ambulatory Visit: Payer: Self-pay

## 2014-06-26 DIAGNOSIS — Z1231 Encounter for screening mammogram for malignant neoplasm of breast: Secondary | ICD-10-CM

## 2014-07-23 DIAGNOSIS — I10 Essential (primary) hypertension: Secondary | ICD-10-CM | POA: Diagnosis not present

## 2014-07-30 DIAGNOSIS — E871 Hypo-osmolality and hyponatremia: Secondary | ICD-10-CM | POA: Diagnosis not present

## 2014-07-30 DIAGNOSIS — R5383 Other fatigue: Secondary | ICD-10-CM | POA: Diagnosis not present

## 2014-07-30 DIAGNOSIS — I10 Essential (primary) hypertension: Secondary | ICD-10-CM | POA: Diagnosis not present

## 2014-08-04 ENCOUNTER — Ambulatory Visit
Admission: RE | Admit: 2014-08-04 | Discharge: 2014-08-04 | Disposition: A | Discharge status: Home / Self Care | Payer: Medicare Other | Source: Ambulatory Visit

## 2014-08-04 DIAGNOSIS — Z1231 Encounter for screening mammogram for malignant neoplasm of breast: Secondary | ICD-10-CM | POA: Diagnosis not present

## 2014-08-12 DIAGNOSIS — H2513 Age-related nuclear cataract, bilateral: Secondary | ICD-10-CM | POA: Diagnosis not present

## 2014-08-12 DIAGNOSIS — H402232 Chronic angle-closure glaucoma, bilateral, moderate stage: Secondary | ICD-10-CM | POA: Diagnosis not present

## 2014-08-12 DIAGNOSIS — H25013 Cortical age-related cataract, bilateral: Secondary | ICD-10-CM | POA: Diagnosis not present

## 2014-08-12 DIAGNOSIS — H04123 Dry eye syndrome of bilateral lacrimal glands: Secondary | ICD-10-CM | POA: Diagnosis not present

## 2015-02-26 DIAGNOSIS — H04123 Dry eye syndrome of bilateral lacrimal glands: Secondary | ICD-10-CM | POA: Diagnosis not present

## 2015-02-26 DIAGNOSIS — H402232 Chronic angle-closure glaucoma, bilateral, moderate stage: Secondary | ICD-10-CM | POA: Diagnosis not present

## 2015-03-06 DIAGNOSIS — I10 Essential (primary) hypertension: Secondary | ICD-10-CM | POA: Diagnosis not present

## 2015-03-06 DIAGNOSIS — M150 Primary generalized (osteo)arthritis: Secondary | ICD-10-CM | POA: Diagnosis not present

## 2015-03-06 DIAGNOSIS — Z780 Asymptomatic menopausal state: Secondary | ICD-10-CM | POA: Diagnosis not present

## 2015-03-06 DIAGNOSIS — Z23 Encounter for immunization: Secondary | ICD-10-CM | POA: Diagnosis not present

## 2015-03-06 DIAGNOSIS — E039 Hypothyroidism, unspecified: Secondary | ICD-10-CM | POA: Diagnosis not present

## 2015-03-06 DIAGNOSIS — R5383 Other fatigue: Secondary | ICD-10-CM | POA: Diagnosis not present

## 2015-03-06 DIAGNOSIS — E871 Hypo-osmolality and hyponatremia: Secondary | ICD-10-CM | POA: Diagnosis not present

## 2015-03-13 DIAGNOSIS — Z23 Encounter for immunization: Secondary | ICD-10-CM | POA: Diagnosis not present

## 2015-03-13 DIAGNOSIS — Z780 Asymptomatic menopausal state: Secondary | ICD-10-CM | POA: Diagnosis not present

## 2015-03-13 DIAGNOSIS — E039 Hypothyroidism, unspecified: Secondary | ICD-10-CM | POA: Diagnosis not present

## 2015-03-13 DIAGNOSIS — M150 Primary generalized (osteo)arthritis: Secondary | ICD-10-CM | POA: Diagnosis not present

## 2015-03-13 DIAGNOSIS — R413 Other amnesia: Secondary | ICD-10-CM | POA: Diagnosis not present

## 2015-03-13 DIAGNOSIS — I10 Essential (primary) hypertension: Secondary | ICD-10-CM | POA: Diagnosis not present

## 2015-05-14 ENCOUNTER — Other Ambulatory Visit: Payer: Self-pay

## 2015-05-14 DIAGNOSIS — Z1231 Encounter for screening mammogram for malignant neoplasm of breast: Secondary | ICD-10-CM

## 2015-08-06 ENCOUNTER — Ambulatory Visit
Admission: RE | Admit: 2015-08-06 | Discharge: 2015-08-06 | Disposition: A | Discharge status: Home / Self Care | Payer: Medicare Other | Source: Ambulatory Visit

## 2015-08-06 DIAGNOSIS — Z1231 Encounter for screening mammogram for malignant neoplasm of breast: Secondary | ICD-10-CM

## 2015-09-02 DIAGNOSIS — H02403 Unspecified ptosis of bilateral eyelids: Secondary | ICD-10-CM | POA: Diagnosis not present

## 2015-09-02 DIAGNOSIS — H04123 Dry eye syndrome of bilateral lacrimal glands: Secondary | ICD-10-CM | POA: Diagnosis not present

## 2015-09-02 DIAGNOSIS — H35371 Puckering of macula, right eye: Secondary | ICD-10-CM | POA: Diagnosis not present

## 2015-09-02 DIAGNOSIS — H2513 Age-related nuclear cataract, bilateral: Secondary | ICD-10-CM | POA: Diagnosis not present

## 2015-09-02 DIAGNOSIS — H35372 Puckering of macula, left eye: Secondary | ICD-10-CM | POA: Diagnosis not present

## 2015-09-02 DIAGNOSIS — H402232 Chronic angle-closure glaucoma, bilateral, moderate stage: Secondary | ICD-10-CM | POA: Diagnosis not present

## 2015-10-16 DIAGNOSIS — E039 Hypothyroidism, unspecified: Secondary | ICD-10-CM | POA: Diagnosis not present

## 2015-10-16 DIAGNOSIS — I10 Essential (primary) hypertension: Secondary | ICD-10-CM | POA: Diagnosis not present

## 2015-10-23 DIAGNOSIS — I10 Essential (primary) hypertension: Secondary | ICD-10-CM | POA: Diagnosis not present

## 2015-10-23 DIAGNOSIS — R413 Other amnesia: Secondary | ICD-10-CM | POA: Diagnosis not present

## 2015-10-23 DIAGNOSIS — E039 Hypothyroidism, unspecified: Secondary | ICD-10-CM | POA: Diagnosis not present

## 2016-03-01 DIAGNOSIS — H402222 Chronic angle-closure glaucoma, left eye, moderate stage: Secondary | ICD-10-CM | POA: Diagnosis not present

## 2016-03-01 DIAGNOSIS — H402212 Chronic angle-closure glaucoma, right eye, moderate stage: Secondary | ICD-10-CM | POA: Diagnosis not present

## 2016-03-01 DIAGNOSIS — H04123 Dry eye syndrome of bilateral lacrimal glands: Secondary | ICD-10-CM | POA: Diagnosis not present

## 2016-04-11 DIAGNOSIS — E039 Hypothyroidism, unspecified: Secondary | ICD-10-CM | POA: Diagnosis not present

## 2016-04-11 DIAGNOSIS — R413 Other amnesia: Secondary | ICD-10-CM | POA: Diagnosis not present

## 2016-04-11 DIAGNOSIS — Z23 Encounter for immunization: Secondary | ICD-10-CM | POA: Diagnosis not present

## 2016-04-11 DIAGNOSIS — I10 Essential (primary) hypertension: Secondary | ICD-10-CM | POA: Diagnosis not present

## 2016-04-18 DIAGNOSIS — I10 Essential (primary) hypertension: Secondary | ICD-10-CM | POA: Diagnosis not present

## 2016-04-18 DIAGNOSIS — M150 Primary generalized (osteo)arthritis: Secondary | ICD-10-CM | POA: Diagnosis not present

## 2016-04-18 DIAGNOSIS — R413 Other amnesia: Secondary | ICD-10-CM | POA: Diagnosis not present

## 2016-04-18 DIAGNOSIS — E039 Hypothyroidism, unspecified: Secondary | ICD-10-CM | POA: Diagnosis not present

## 2016-05-23 DIAGNOSIS — M79645 Pain in left finger(s): Secondary | ICD-10-CM | POA: Diagnosis not present

## 2016-06-07 DIAGNOSIS — S6010XD Contusion of unspecified finger with damage to nail, subsequent encounter: Secondary | ICD-10-CM | POA: Diagnosis not present

## 2016-06-07 DIAGNOSIS — S6010XA Contusion of unspecified finger with damage to nail, initial encounter: Secondary | ICD-10-CM | POA: Insufficient documentation

## 2016-07-11 ENCOUNTER — Other Ambulatory Visit: Payer: Self-pay | Admitting: Internal Medicine

## 2016-07-11 DIAGNOSIS — Z1231 Encounter for screening mammogram for malignant neoplasm of breast: Secondary | ICD-10-CM

## 2016-07-19 DIAGNOSIS — I10 Essential (primary) hypertension: Secondary | ICD-10-CM | POA: Diagnosis not present

## 2016-07-19 DIAGNOSIS — S60132D Contusion of left middle finger with damage to nail, subsequent encounter: Secondary | ICD-10-CM | POA: Diagnosis not present

## 2016-08-09 DIAGNOSIS — S60132D Contusion of left middle finger with damage to nail, subsequent encounter: Secondary | ICD-10-CM | POA: Diagnosis not present

## 2016-08-10 DIAGNOSIS — M150 Primary generalized (osteo)arthritis: Secondary | ICD-10-CM | POA: Diagnosis not present

## 2016-08-10 DIAGNOSIS — I10 Essential (primary) hypertension: Secondary | ICD-10-CM | POA: Diagnosis not present

## 2016-08-10 DIAGNOSIS — E039 Hypothyroidism, unspecified: Secondary | ICD-10-CM | POA: Diagnosis not present

## 2016-08-11 ENCOUNTER — Ambulatory Visit
Admission: RE | Admit: 2016-08-11 | Discharge: 2016-08-11 | Disposition: A | Discharge status: Home / Self Care | Payer: Medicare Other | Source: Ambulatory Visit | Attending: Internal Medicine | Admitting: Internal Medicine

## 2016-08-11 DIAGNOSIS — Z1231 Encounter for screening mammogram for malignant neoplasm of breast: Secondary | ICD-10-CM

## 2016-09-05 DIAGNOSIS — H2513 Age-related nuclear cataract, bilateral: Secondary | ICD-10-CM | POA: Diagnosis not present

## 2016-09-05 DIAGNOSIS — H402222 Chronic angle-closure glaucoma, left eye, moderate stage: Secondary | ICD-10-CM | POA: Diagnosis not present

## 2016-09-05 DIAGNOSIS — H04123 Dry eye syndrome of bilateral lacrimal glands: Secondary | ICD-10-CM | POA: Diagnosis not present

## 2016-09-05 DIAGNOSIS — H402211 Chronic angle-closure glaucoma, right eye, mild stage: Secondary | ICD-10-CM | POA: Diagnosis not present

## 2017-02-15 DIAGNOSIS — I10 Essential (primary) hypertension: Secondary | ICD-10-CM | POA: Diagnosis not present

## 2017-02-15 DIAGNOSIS — E039 Hypothyroidism, unspecified: Secondary | ICD-10-CM | POA: Diagnosis not present

## 2017-02-15 DIAGNOSIS — E871 Hypo-osmolality and hyponatremia: Secondary | ICD-10-CM | POA: Diagnosis not present

## 2017-02-20 DIAGNOSIS — E039 Hypothyroidism, unspecified: Secondary | ICD-10-CM | POA: Diagnosis not present

## 2017-02-20 DIAGNOSIS — E871 Hypo-osmolality and hyponatremia: Secondary | ICD-10-CM | POA: Diagnosis not present

## 2017-02-20 DIAGNOSIS — Z23 Encounter for immunization: Secondary | ICD-10-CM | POA: Diagnosis not present

## 2017-02-20 DIAGNOSIS — I129 Hypertensive chronic kidney disease with stage 1 through stage 4 chronic kidney disease, or unspecified chronic kidney disease: Secondary | ICD-10-CM | POA: Diagnosis not present

## 2017-02-20 DIAGNOSIS — N183 Chronic kidney disease, stage 3 (moderate): Secondary | ICD-10-CM | POA: Diagnosis not present

## 2017-03-03 ENCOUNTER — Ambulatory Visit: Payer: Medicare Other | Admitting: Podiatry

## 2017-03-07 DIAGNOSIS — H402222 Chronic angle-closure glaucoma, left eye, moderate stage: Secondary | ICD-10-CM | POA: Diagnosis not present

## 2017-03-07 DIAGNOSIS — H402211 Chronic angle-closure glaucoma, right eye, mild stage: Secondary | ICD-10-CM | POA: Diagnosis not present

## 2017-03-13 ENCOUNTER — Encounter: Payer: Self-pay | Admitting: Podiatry

## 2017-03-13 ENCOUNTER — Ambulatory Visit (INDEPENDENT_AMBULATORY_CARE_PROVIDER_SITE_OTHER): Payer: Medicare Other | Admitting: Podiatry

## 2017-03-13 ENCOUNTER — Ambulatory Visit (INDEPENDENT_AMBULATORY_CARE_PROVIDER_SITE_OTHER): Payer: Medicare Other

## 2017-03-13 VITALS — BP 117/64 | HR 69 | Resp 16

## 2017-03-13 DIAGNOSIS — L84 Corns and callosities: Secondary | ICD-10-CM

## 2017-03-13 DIAGNOSIS — M2010 Hallux valgus (acquired), unspecified foot: Secondary | ICD-10-CM | POA: Diagnosis not present

## 2017-03-13 DIAGNOSIS — M779 Enthesopathy, unspecified: Secondary | ICD-10-CM

## 2017-03-13 DIAGNOSIS — M2042 Other hammer toe(s) (acquired), left foot: Secondary | ICD-10-CM

## 2017-03-13 MED ORDER — TRIAMCINOLONE ACETONIDE 10 MG/ML IJ SUSP
10.0000 mg | Freq: Once | INTRAMUSCULAR | Status: AC
  Administered 2017-03-13: 10 mg

## 2017-03-13 NOTE — Progress Notes (Signed)
Subjective:    Patient ID: Christy Buck, female   DOB: 83 y.o.   MRN: [id number]   HPI patient presents stating this bunion on my right foot has become increasingly sore and my fifth toe on my left foot is sore and I feel like there is fluid beneath each of them states it's been hurting for around 6 months    Review of Systems  All other systems reviewed and are negative.       Objective:  Physical Exam  Constitutional: She appears well-developed and well-nourished.  Cardiovascular: Intact distal pulses.   Pulmonary/Chest: Effort normal.  Musculoskeletal: Normal range of motion.  Neurological: She is alert.  Skin: Skin is warm.  Nursing note and vitals reviewed.  neuro at this intact muscle strength adequate range of motion within normal limits. Patient's found to have structural bunion deformity right over left with redness and swelling around the first MPJ with pain and keratotic lesion with rotational component fifth digit left with fluid around the interphalangeal joint. 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

## 2017-03-13 NOTE — Patient Instructions (Signed)

## 2017-06-27 DIAGNOSIS — N183 Chronic kidney disease, stage 3 (moderate): Secondary | ICD-10-CM | POA: Diagnosis not present

## 2017-06-27 DIAGNOSIS — Z780 Asymptomatic menopausal state: Secondary | ICD-10-CM | POA: Diagnosis not present

## 2017-06-27 DIAGNOSIS — M810 Age-related osteoporosis without current pathological fracture: Secondary | ICD-10-CM | POA: Diagnosis not present

## 2017-06-27 DIAGNOSIS — I129 Hypertensive chronic kidney disease with stage 1 through stage 4 chronic kidney disease, or unspecified chronic kidney disease: Secondary | ICD-10-CM | POA: Diagnosis not present

## 2017-06-27 DIAGNOSIS — E871 Hypo-osmolality and hyponatremia: Secondary | ICD-10-CM | POA: Diagnosis not present

## 2017-06-27 DIAGNOSIS — E039 Hypothyroidism, unspecified: Secondary | ICD-10-CM | POA: Diagnosis not present

## 2017-06-27 DIAGNOSIS — Z23 Encounter for immunization: Secondary | ICD-10-CM | POA: Diagnosis not present

## 2017-06-27 DIAGNOSIS — Z0000 Encounter for general adult medical examination without abnormal findings: Secondary | ICD-10-CM | POA: Diagnosis not present

## 2017-06-27 DIAGNOSIS — I10 Essential (primary) hypertension: Secondary | ICD-10-CM | POA: Diagnosis not present

## 2017-07-07 ENCOUNTER — Other Ambulatory Visit: Payer: Self-pay | Admitting: Internal Medicine

## 2017-07-07 DIAGNOSIS — Z1231 Encounter for screening mammogram for malignant neoplasm of breast: Secondary | ICD-10-CM

## 2017-07-12 DIAGNOSIS — R413 Other amnesia: Secondary | ICD-10-CM | POA: Diagnosis not present

## 2017-07-12 DIAGNOSIS — E871 Hypo-osmolality and hyponatremia: Secondary | ICD-10-CM | POA: Diagnosis not present

## 2017-07-12 DIAGNOSIS — E039 Hypothyroidism, unspecified: Secondary | ICD-10-CM | POA: Diagnosis not present

## 2017-07-12 DIAGNOSIS — N183 Chronic kidney disease, stage 3 (moderate): Secondary | ICD-10-CM | POA: Diagnosis not present

## 2017-07-12 DIAGNOSIS — I10 Essential (primary) hypertension: Secondary | ICD-10-CM | POA: Diagnosis not present

## 2017-08-16 ENCOUNTER — Ambulatory Visit
Admission: RE | Admit: 2017-08-16 | Discharge: 2017-08-16 | Disposition: A | Discharge status: Home / Self Care | Payer: Medicare Other | Source: Ambulatory Visit | Attending: Internal Medicine | Admitting: Internal Medicine

## 2017-08-16 DIAGNOSIS — Z1231 Encounter for screening mammogram for malignant neoplasm of breast: Secondary | ICD-10-CM

## 2017-09-07 DIAGNOSIS — H25013 Cortical age-related cataract, bilateral: Secondary | ICD-10-CM | POA: Diagnosis not present

## 2017-09-07 DIAGNOSIS — H402222 Chronic angle-closure glaucoma, left eye, moderate stage: Secondary | ICD-10-CM | POA: Diagnosis not present

## 2017-09-07 DIAGNOSIS — H2513 Age-related nuclear cataract, bilateral: Secondary | ICD-10-CM | POA: Diagnosis not present

## 2017-09-07 DIAGNOSIS — H402211 Chronic angle-closure glaucoma, right eye, mild stage: Secondary | ICD-10-CM | POA: Diagnosis not present

## 2017-10-05 ENCOUNTER — Encounter: Payer: Self-pay | Admitting: Podiatry

## 2017-10-05 ENCOUNTER — Ambulatory Visit (INDEPENDENT_AMBULATORY_CARE_PROVIDER_SITE_OTHER): Payer: Medicare Other | Admitting: Podiatry

## 2017-10-05 DIAGNOSIS — M2042 Other hammer toe(s) (acquired), left foot: Secondary | ICD-10-CM | POA: Diagnosis not present

## 2017-10-05 DIAGNOSIS — M779 Enthesopathy, unspecified: Secondary | ICD-10-CM

## 2017-10-05 MED ORDER — TRIAMCINOLONE ACETONIDE 10 MG/ML IJ SUSP
10.0000 mg | Freq: Once | INTRAMUSCULAR | Status: AC
  Administered 2017-10-05: 10 mg

## 2017-10-05 NOTE — Progress Notes (Signed)
Subjective:   Patient ID: Christy Buck, female   DOB: 84 y.o.   MRN: [id number]   HPI Patient presents stating the left fifth digit has been bothering her a lot in the right first metatarsal is doing well   ROS      Objective:  Physical Exam  Neurovascular status intact with patient found to have inflammation around the fifth digit left had a proximal phalanx with fluid buildup and digital deformity with rotation of the toe noted     Assessment:  Hammertoe deformity fifth digit left with inflammatory capsulitis of the interphalangeal joint     Plan:  Educated her on the consideration for arthroplasty of this but at this point we will hold off and I did go ahead did a proximal nerve block and then carefully injected the interphalangeal joint with 1 mg dexamethasone 1 mg Kenalog debrided lesion applied padding and we will see the results of this procedure

## 2017-10-12 ENCOUNTER — Ambulatory Visit: Payer: Medicare Other | Admitting: Podiatry

## 2017-10-12 ENCOUNTER — Ambulatory Visit: Payer: Federal, State, Local not specified - PPO | Admitting: Podiatry

## 2017-11-15 ENCOUNTER — Encounter: Payer: Self-pay | Admitting: Podiatry

## 2017-11-15 ENCOUNTER — Other Ambulatory Visit: Payer: Self-pay

## 2017-11-15 ENCOUNTER — Ambulatory Visit (INDEPENDENT_AMBULATORY_CARE_PROVIDER_SITE_OTHER): Payer: Medicare Other | Admitting: Podiatry

## 2017-11-15 DIAGNOSIS — M2042 Other hammer toe(s) (acquired), left foot: Secondary | ICD-10-CM | POA: Diagnosis not present

## 2017-11-15 NOTE — Patient Instructions (Signed)
Pre-Operative Instructions  Congratulations, you have decided to take an important step towards improving your quality of life.  You can be assured that the doctors and staff at [facility] will be with you every step of the way.  Here are some important things you should know:  1. Plan to be at the surgery center/hospital at least 1 (one) hour prior to your scheduled time, unless otherwise directed by the surgical center/hospital staff.  You must have a responsible adult accompany you, remain during the surgery and drive you home.  Make sure you have directions to the surgical center/hospital to ensure you arrive on time. 2. If you are having surgery at [facility] or [facility] hospitals, you will need a copy of your medical history and physical form from your family physician within one month prior to the date of surgery. We will give you a form for your primary physician to complete.  3. We make every effort to accommodate the date you request for surgery.  However, there are times where surgery dates or times have to be moved.  We will contact you as soon as possible if a change in schedule is required.   4. No aspirin/ibuprofen for one week before surgery.  If you are on aspirin, any non-steroidal anti-inflammatory medications (Mobic, Aleve, Ibuprofen) should not be taken seven (7) days prior to your surgery.  You make take Tylenol for pain prior to surgery.  5. Medications - If you are taking daily heart and blood pressure medications, seizure, reflux, allergy, asthma, anxiety, pain or diabetes medications, make sure you notify the surgery center/hospital before the day of surgery so they can tell you which medications you should take or avoid the day of surgery. 6. No food or drink after midnight the night before surgery unless directed otherwise by surgical center/hospital staff. 7. No alcoholic beverages 24-hours prior to surgery.  No smoking 24-hours prior or 24-hours after  surgery. 8. Wear loose pants or shorts. They should be loose enough to fit over bandages, boots, and casts. 9. Don't wear slip-on shoes. Sneakers are preferred. 10. Bring your boot with you to the surgery center/hospital.  Also bring crutches or a walker if your physician has prescribed it for you.  If you do not have this equipment, it will be provided for you after surgery. 11. If you have not been contacted by the surgery center/hospital by the day before your surgery, call to confirm the date and time of your surgery. 12. Leave-time from work may vary depending on the type of surgery you have.  Appropriate arrangements should be made prior to surgery with your employer. 13. Prescriptions will be provided immediately following surgery by your doctor.  Fill these as soon as possible after surgery and take the medication as directed. Pain medications will not be refilled on weekends and must be approved by the doctor. 14. Remove nail polish on the operative foot and avoid getting pedicures prior to surgery. 15. Wash the night before surgery.  The night before surgery wash the foot and leg well with water and the antibacterial soap provided. Be sure to pay special attention to beneath the toenails and in between the toes.  Wash for at least three (3) minutes. Rinse thoroughly with water and dry well with a towel.  Perform this wash unless told not to do so by your physician.  Enclosed: 1 Ice pack (please put in freezer the night before surgery)   1 Hibiclens skin cleaner     Pre-op instructions  If you have any questions regarding the instructions, please do not hesitate to call our office.  [place]: [address], [place], [place] [address] -- [phone]  [place]: [address] [place], [place] [address] -- [phone]  [place]: [address]  [place], [place] [address] -- [phone]  [place]: [address], [address], [place], [place] [address] -- [phone]  Website:  [email or web address] 

## 2017-11-15 NOTE — Progress Notes (Signed)
Subjective:   Patient ID: Christy Buck, female   DOB: 84 y.o.   MRN: [id number]   HPI Patient presents stating my fifth toe has still been bothering me and the medication and the injection and the trimming did not help and I know I had surgery on the other fifth toe and I want this one fixed the same way   ROS      Objective:  Physical Exam  Neurovascular status was noted to be intact with good digital perfusion and patient is noted to have a painful keratotic lesion fifth digit left is very painful at the head of the proximal phalanx and hard for her to wear shoe gear with comfortably.  The toe is warm and there is no indications of circulatory disease and I questioned her on any kind of claudication symptoms and she does not have this     Assessment:  Digital deformity fifth digit left with keratotic lesion chronic in nature with failure to respond to conservative treatment     Plan:  Reviewed arthroplasty procedure and explained risk and patient wants procedure and I allowed her to sign consent form going over alternative treatments complications associated with this.  She is willing to accept the risk of surgery today she signed consent form is given all preoperative instructions.  Understands total recovery can take upwards of 6 months and she will be in a surgical shoe for approximately 4 weeks.  She is encouraged to call with any questions she may have

## 2017-12-05 ENCOUNTER — Telehealth: Payer: Self-pay | Admitting: Podiatry

## 2017-12-05 DIAGNOSIS — I10 Essential (primary) hypertension: Secondary | ICD-10-CM | POA: Diagnosis not present

## 2017-12-05 DIAGNOSIS — M2042 Other hammer toe(s) (acquired), left foot: Secondary | ICD-10-CM | POA: Diagnosis not present

## 2017-12-05 NOTE — Telephone Encounter (Signed)
This is [facility], call back number is [phone]. Calling about a prescription sent in today. I have some questions, so if you could please call me back at [phone] to verify this prescription. Thank you.

## 2017-12-05 NOTE — Telephone Encounter (Signed)
I spoke with Sharyl NimrodMeredith - [facility], she states she wants to make sure the hydrocodone is one tablet every 4-6 hours and I told her it was.

## 2017-12-13 ENCOUNTER — Ambulatory Visit (INDEPENDENT_AMBULATORY_CARE_PROVIDER_SITE_OTHER): Payer: Medicare Other

## 2017-12-13 ENCOUNTER — Ambulatory Visit (INDEPENDENT_AMBULATORY_CARE_PROVIDER_SITE_OTHER): Payer: Self-pay | Admitting: Podiatry

## 2017-12-13 DIAGNOSIS — M2042 Other hammer toe(s) (acquired), left foot: Secondary | ICD-10-CM

## 2017-12-13 NOTE — Progress Notes (Signed)
Subjective:   Patient ID: Christy Buck, female   DOB: 84 y.o.   MRN: [id number]   HPI Patient presents stating that she is doing very well   ROS      Objective:  Physical Exam  Neurovascular status intact negative Homans sign with fifth digit left healing very well with wound edges well coapted stitches in place     Assessment:  Doing well post arthroplasty fifth digit left     Plan:  Sterile dressing reapplied continue with open toed shoes reappoint 2 weeks suture removal  X-rays indicate that the bone resection is satisfactory

## 2017-12-27 ENCOUNTER — Ambulatory Visit (INDEPENDENT_AMBULATORY_CARE_PROVIDER_SITE_OTHER): Payer: Self-pay | Admitting: Podiatry

## 2017-12-27 ENCOUNTER — Ambulatory Visit (INDEPENDENT_AMBULATORY_CARE_PROVIDER_SITE_OTHER): Payer: Medicare Other

## 2017-12-27 DIAGNOSIS — M2042 Other hammer toe(s) (acquired), left foot: Secondary | ICD-10-CM | POA: Diagnosis not present

## 2017-12-27 NOTE — Progress Notes (Signed)
Subjective:   Patient ID: Christy Buck, female   DOB: 84 y.o.   MRN: [id number]   HPI Patient presents stating my toe is doing okay with occasional soreness   ROS      Objective:  Physical Exam  Neurovascular status intact with fifth digit left healing well wound edges well coapted toe in good alignment     Assessment:  Doing well post arthroplasty digit 5 left     Plan:  X-ray reviewed stitches removed wound edges coapted well and allow patient to return to normal activity as she is able to do  X-ray indicates satisfactory resection of bone with good alignment of the fifth digit

## 2018-01-24 DIAGNOSIS — I129 Hypertensive chronic kidney disease with stage 1 through stage 4 chronic kidney disease, or unspecified chronic kidney disease: Secondary | ICD-10-CM | POA: Diagnosis not present

## 2018-01-24 DIAGNOSIS — I10 Essential (primary) hypertension: Secondary | ICD-10-CM | POA: Diagnosis not present

## 2018-01-24 DIAGNOSIS — E871 Hypo-osmolality and hyponatremia: Secondary | ICD-10-CM | POA: Diagnosis not present

## 2018-01-24 DIAGNOSIS — N183 Chronic kidney disease, stage 3 (moderate): Secondary | ICD-10-CM | POA: Diagnosis not present

## 2018-01-24 DIAGNOSIS — E039 Hypothyroidism, unspecified: Secondary | ICD-10-CM | POA: Diagnosis not present

## 2018-01-31 DIAGNOSIS — E039 Hypothyroidism, unspecified: Secondary | ICD-10-CM | POA: Diagnosis not present

## 2018-01-31 DIAGNOSIS — I129 Hypertensive chronic kidney disease with stage 1 through stage 4 chronic kidney disease, or unspecified chronic kidney disease: Secondary | ICD-10-CM | POA: Diagnosis not present

## 2018-01-31 DIAGNOSIS — N183 Chronic kidney disease, stage 3 (moderate): Secondary | ICD-10-CM | POA: Diagnosis not present

## 2018-01-31 DIAGNOSIS — M150 Primary generalized (osteo)arthritis: Secondary | ICD-10-CM | POA: Diagnosis not present

## 2018-01-31 DIAGNOSIS — I10 Essential (primary) hypertension: Secondary | ICD-10-CM | POA: Diagnosis not present

## 2018-02-09 DIAGNOSIS — Z23 Encounter for immunization: Secondary | ICD-10-CM | POA: Diagnosis not present

## 2018-03-02 ENCOUNTER — Encounter: Payer: Self-pay | Admitting: Podiatry

## 2018-03-02 NOTE — Progress Notes (Signed)
DOS 12/05/2017 Hammertoe Repair 5th LT

## 2018-03-09 DIAGNOSIS — H402222 Chronic angle-closure glaucoma, left eye, moderate stage: Secondary | ICD-10-CM | POA: Diagnosis not present

## 2018-03-09 DIAGNOSIS — H402211 Chronic angle-closure glaucoma, right eye, mild stage: Secondary | ICD-10-CM | POA: Diagnosis not present

## 2018-06-19 DIAGNOSIS — Z0000 Encounter for general adult medical examination without abnormal findings: Secondary | ICD-10-CM | POA: Diagnosis not present

## 2018-07-13 ENCOUNTER — Other Ambulatory Visit: Payer: Self-pay | Admitting: Internal Medicine

## 2018-07-13 DIAGNOSIS — Z1231 Encounter for screening mammogram for malignant neoplasm of breast: Secondary | ICD-10-CM

## 2018-07-25 DIAGNOSIS — E039 Hypothyroidism, unspecified: Secondary | ICD-10-CM | POA: Diagnosis not present

## 2018-07-25 DIAGNOSIS — I10 Essential (primary) hypertension: Secondary | ICD-10-CM | POA: Diagnosis not present

## 2018-07-25 DIAGNOSIS — I129 Hypertensive chronic kidney disease with stage 1 through stage 4 chronic kidney disease, or unspecified chronic kidney disease: Secondary | ICD-10-CM | POA: Diagnosis not present

## 2018-07-25 DIAGNOSIS — N183 Chronic kidney disease, stage 3 (moderate): Secondary | ICD-10-CM | POA: Diagnosis not present

## 2018-07-25 DIAGNOSIS — Z0000 Encounter for general adult medical examination without abnormal findings: Secondary | ICD-10-CM | POA: Diagnosis not present

## 2018-08-01 DIAGNOSIS — I129 Hypertensive chronic kidney disease with stage 1 through stage 4 chronic kidney disease, or unspecified chronic kidney disease: Secondary | ICD-10-CM | POA: Diagnosis not present

## 2018-08-01 DIAGNOSIS — R413 Other amnesia: Secondary | ICD-10-CM | POA: Diagnosis not present

## 2018-08-01 DIAGNOSIS — E039 Hypothyroidism, unspecified: Secondary | ICD-10-CM | POA: Diagnosis not present

## 2018-08-01 DIAGNOSIS — N183 Chronic kidney disease, stage 3 (moderate): Secondary | ICD-10-CM | POA: Diagnosis not present

## 2018-08-01 DIAGNOSIS — E871 Hypo-osmolality and hyponatremia: Secondary | ICD-10-CM | POA: Diagnosis not present

## 2018-08-01 DIAGNOSIS — I38 Endocarditis, valve unspecified: Secondary | ICD-10-CM | POA: Diagnosis not present

## 2018-08-01 DIAGNOSIS — M150 Primary generalized (osteo)arthritis: Secondary | ICD-10-CM | POA: Diagnosis not present

## 2018-08-21 ENCOUNTER — Other Ambulatory Visit: Payer: Self-pay

## 2018-08-21 ENCOUNTER — Ambulatory Visit
Admission: RE | Admit: 2018-08-21 | Discharge: 2018-08-21 | Disposition: A | Discharge status: Home / Self Care | Payer: Medicare Other | Source: Ambulatory Visit | Attending: Internal Medicine | Admitting: Internal Medicine

## 2018-08-21 DIAGNOSIS — Z1231 Encounter for screening mammogram for malignant neoplasm of breast: Secondary | ICD-10-CM | POA: Diagnosis not present

## 2018-12-13 DIAGNOSIS — H402211 Chronic angle-closure glaucoma, right eye, mild stage: Secondary | ICD-10-CM | POA: Diagnosis not present

## 2018-12-13 DIAGNOSIS — H2513 Age-related nuclear cataract, bilateral: Secondary | ICD-10-CM | POA: Diagnosis not present

## 2018-12-13 DIAGNOSIS — H25013 Cortical age-related cataract, bilateral: Secondary | ICD-10-CM | POA: Diagnosis not present

## 2018-12-13 DIAGNOSIS — H402222 Chronic angle-closure glaucoma, left eye, moderate stage: Secondary | ICD-10-CM | POA: Diagnosis not present

## 2019-01-04 ENCOUNTER — Other Ambulatory Visit: Payer: Self-pay

## 2019-02-27 DIAGNOSIS — I129 Hypertensive chronic kidney disease with stage 1 through stage 4 chronic kidney disease, or unspecified chronic kidney disease: Secondary | ICD-10-CM | POA: Diagnosis not present

## 2019-02-27 DIAGNOSIS — E039 Hypothyroidism, unspecified: Secondary | ICD-10-CM | POA: Diagnosis not present

## 2019-02-27 DIAGNOSIS — N183 Chronic kidney disease, stage 3 (moderate): Secondary | ICD-10-CM | POA: Diagnosis not present

## 2019-03-11 DIAGNOSIS — R413 Other amnesia: Secondary | ICD-10-CM | POA: Diagnosis not present

## 2019-03-11 DIAGNOSIS — I10 Essential (primary) hypertension: Secondary | ICD-10-CM | POA: Diagnosis not present

## 2019-03-11 DIAGNOSIS — E871 Hypo-osmolality and hyponatremia: Secondary | ICD-10-CM | POA: Diagnosis not present

## 2019-03-11 DIAGNOSIS — Z23 Encounter for immunization: Secondary | ICD-10-CM | POA: Diagnosis not present

## 2019-03-11 DIAGNOSIS — M150 Primary generalized (osteo)arthritis: Secondary | ICD-10-CM | POA: Diagnosis not present

## 2019-03-11 DIAGNOSIS — I129 Hypertensive chronic kidney disease with stage 1 through stage 4 chronic kidney disease, or unspecified chronic kidney disease: Secondary | ICD-10-CM | POA: Diagnosis not present

## 2019-03-11 DIAGNOSIS — E039 Hypothyroidism, unspecified: Secondary | ICD-10-CM | POA: Diagnosis not present

## 2019-03-11 DIAGNOSIS — I38 Endocarditis, valve unspecified: Secondary | ICD-10-CM | POA: Diagnosis not present

## 2019-03-29 DIAGNOSIS — Z03818 Encounter for observation for suspected exposure to other biological agents ruled out: Secondary | ICD-10-CM | POA: Diagnosis not present

## 2019-06-18 DIAGNOSIS — H04123 Dry eye syndrome of bilateral lacrimal glands: Secondary | ICD-10-CM | POA: Diagnosis not present

## 2019-06-18 DIAGNOSIS — H402211 Chronic angle-closure glaucoma, right eye, mild stage: Secondary | ICD-10-CM | POA: Diagnosis not present

## 2019-06-18 DIAGNOSIS — H402222 Chronic angle-closure glaucoma, left eye, moderate stage: Secondary | ICD-10-CM | POA: Diagnosis not present

## 2019-07-12 ENCOUNTER — Other Ambulatory Visit: Payer: Self-pay | Admitting: Internal Medicine

## 2019-07-12 DIAGNOSIS — Z1231 Encounter for screening mammogram for malignant neoplasm of breast: Secondary | ICD-10-CM

## 2019-08-22 ENCOUNTER — Ambulatory Visit: Payer: Medicare Other

## 2019-08-23 ENCOUNTER — Ambulatory Visit: Payer: Medicare Other

## 2019-08-23 DIAGNOSIS — Z23 Encounter for immunization: Secondary | ICD-10-CM | POA: Diagnosis not present

## 2019-08-29 ENCOUNTER — Ambulatory Visit: Payer: Medicare Other

## 2019-08-29 DIAGNOSIS — I129 Hypertensive chronic kidney disease with stage 1 through stage 4 chronic kidney disease, or unspecified chronic kidney disease: Secondary | ICD-10-CM | POA: Diagnosis not present

## 2019-08-29 DIAGNOSIS — E039 Hypothyroidism, unspecified: Secondary | ICD-10-CM | POA: Diagnosis not present

## 2019-08-29 DIAGNOSIS — I10 Essential (primary) hypertension: Secondary | ICD-10-CM | POA: Diagnosis not present

## 2019-08-29 DIAGNOSIS — M150 Primary generalized (osteo)arthritis: Secondary | ICD-10-CM | POA: Diagnosis not present

## 2019-08-29 DIAGNOSIS — E871 Hypo-osmolality and hyponatremia: Secondary | ICD-10-CM | POA: Diagnosis not present

## 2019-08-29 DIAGNOSIS — Z0000 Encounter for general adult medical examination without abnormal findings: Secondary | ICD-10-CM | POA: Diagnosis not present

## 2019-09-05 DIAGNOSIS — R413 Other amnesia: Secondary | ICD-10-CM | POA: Diagnosis not present

## 2019-09-05 DIAGNOSIS — I38 Endocarditis, valve unspecified: Secondary | ICD-10-CM | POA: Diagnosis not present

## 2019-09-05 DIAGNOSIS — E039 Hypothyroidism, unspecified: Secondary | ICD-10-CM | POA: Diagnosis not present

## 2019-09-05 DIAGNOSIS — I129 Hypertensive chronic kidney disease with stage 1 through stage 4 chronic kidney disease, or unspecified chronic kidney disease: Secondary | ICD-10-CM | POA: Diagnosis not present

## 2019-09-05 DIAGNOSIS — M150 Primary generalized (osteo)arthritis: Secondary | ICD-10-CM | POA: Diagnosis not present

## 2019-09-05 DIAGNOSIS — I10 Essential (primary) hypertension: Secondary | ICD-10-CM | POA: Diagnosis not present

## 2019-09-13 ENCOUNTER — Other Ambulatory Visit: Payer: Self-pay

## 2019-09-13 ENCOUNTER — Ambulatory Visit
Admission: RE | Admit: 2019-09-13 | Discharge: 2019-09-13 | Disposition: A | Discharge status: Home / Self Care | Payer: Medicare Other | Source: Ambulatory Visit | Attending: Internal Medicine | Admitting: Internal Medicine

## 2019-09-13 DIAGNOSIS — Z1231 Encounter for screening mammogram for malignant neoplasm of breast: Secondary | ICD-10-CM

## 2020-02-06 DIAGNOSIS — I10 Essential (primary) hypertension: Secondary | ICD-10-CM | POA: Diagnosis not present

## 2020-02-06 DIAGNOSIS — I129 Hypertensive chronic kidney disease with stage 1 through stage 4 chronic kidney disease, or unspecified chronic kidney disease: Secondary | ICD-10-CM | POA: Diagnosis not present

## 2020-02-06 DIAGNOSIS — E039 Hypothyroidism, unspecified: Secondary | ICD-10-CM | POA: Diagnosis not present

## 2020-02-06 DIAGNOSIS — M150 Primary generalized (osteo)arthritis: Secondary | ICD-10-CM | POA: Diagnosis not present

## 2020-02-13 DIAGNOSIS — Z23 Encounter for immunization: Secondary | ICD-10-CM | POA: Diagnosis not present

## 2020-02-13 DIAGNOSIS — I10 Essential (primary) hypertension: Secondary | ICD-10-CM | POA: Diagnosis not present

## 2020-02-13 DIAGNOSIS — I38 Endocarditis, valve unspecified: Secondary | ICD-10-CM | POA: Diagnosis not present

## 2020-02-13 DIAGNOSIS — N182 Chronic kidney disease, stage 2 (mild): Secondary | ICD-10-CM | POA: Diagnosis not present

## 2020-02-13 DIAGNOSIS — M150 Primary generalized (osteo)arthritis: Secondary | ICD-10-CM | POA: Diagnosis not present

## 2020-02-13 DIAGNOSIS — I129 Hypertensive chronic kidney disease with stage 1 through stage 4 chronic kidney disease, or unspecified chronic kidney disease: Secondary | ICD-10-CM | POA: Diagnosis not present

## 2020-02-13 DIAGNOSIS — R413 Other amnesia: Secondary | ICD-10-CM | POA: Diagnosis not present

## 2020-02-13 DIAGNOSIS — E039 Hypothyroidism, unspecified: Secondary | ICD-10-CM | POA: Diagnosis not present

## 2020-05-22 DIAGNOSIS — Z23 Encounter for immunization: Secondary | ICD-10-CM | POA: Diagnosis not present

## 2020-09-01 ENCOUNTER — Other Ambulatory Visit: Payer: Self-pay | Admitting: Internal Medicine

## 2020-09-01 DIAGNOSIS — Z1231 Encounter for screening mammogram for malignant neoplasm of breast: Secondary | ICD-10-CM

## 2020-09-17 DIAGNOSIS — R413 Other amnesia: Secondary | ICD-10-CM | POA: Diagnosis not present

## 2020-09-17 DIAGNOSIS — I10 Essential (primary) hypertension: Secondary | ICD-10-CM | POA: Diagnosis not present

## 2020-09-17 DIAGNOSIS — E039 Hypothyroidism, unspecified: Secondary | ICD-10-CM | POA: Diagnosis not present

## 2020-09-17 DIAGNOSIS — N182 Chronic kidney disease, stage 2 (mild): Secondary | ICD-10-CM | POA: Diagnosis not present

## 2020-09-17 DIAGNOSIS — I129 Hypertensive chronic kidney disease with stage 1 through stage 4 chronic kidney disease, or unspecified chronic kidney disease: Secondary | ICD-10-CM | POA: Diagnosis not present

## 2020-09-24 DIAGNOSIS — I38 Endocarditis, valve unspecified: Secondary | ICD-10-CM | POA: Diagnosis not present

## 2020-09-24 DIAGNOSIS — I10 Essential (primary) hypertension: Secondary | ICD-10-CM | POA: Diagnosis not present

## 2020-09-24 DIAGNOSIS — R413 Other amnesia: Secondary | ICD-10-CM | POA: Diagnosis not present

## 2020-09-24 DIAGNOSIS — I129 Hypertensive chronic kidney disease with stage 1 through stage 4 chronic kidney disease, or unspecified chronic kidney disease: Secondary | ICD-10-CM | POA: Diagnosis not present

## 2020-09-24 DIAGNOSIS — Z0000 Encounter for general adult medical examination without abnormal findings: Secondary | ICD-10-CM | POA: Diagnosis not present

## 2020-09-24 DIAGNOSIS — E039 Hypothyroidism, unspecified: Secondary | ICD-10-CM | POA: Diagnosis not present

## 2020-09-24 DIAGNOSIS — N182 Chronic kidney disease, stage 2 (mild): Secondary | ICD-10-CM | POA: Diagnosis not present

## 2020-09-24 DIAGNOSIS — M150 Primary generalized (osteo)arthritis: Secondary | ICD-10-CM | POA: Diagnosis not present

## 2020-10-26 ENCOUNTER — Ambulatory Visit
Admission: RE | Admit: 2020-10-26 | Discharge: 2020-10-26 | Disposition: A | Discharge status: Home / Self Care | Payer: Medicare Other | Source: Ambulatory Visit | Attending: Internal Medicine | Admitting: Internal Medicine

## 2020-10-26 ENCOUNTER — Other Ambulatory Visit: Payer: Self-pay

## 2020-10-26 DIAGNOSIS — Z1231 Encounter for screening mammogram for malignant neoplasm of breast: Secondary | ICD-10-CM

## 2020-12-14 ENCOUNTER — Other Ambulatory Visit: Payer: Self-pay

## 2020-12-14 ENCOUNTER — Ambulatory Visit (INDEPENDENT_AMBULATORY_CARE_PROVIDER_SITE_OTHER): Payer: Medicare Other | Admitting: Podiatry

## 2020-12-14 ENCOUNTER — Encounter: Payer: Self-pay | Admitting: Podiatry

## 2020-12-14 ENCOUNTER — Ambulatory Visit (INDEPENDENT_AMBULATORY_CARE_PROVIDER_SITE_OTHER): Payer: Medicare Other

## 2020-12-14 DIAGNOSIS — L84 Corns and callosities: Secondary | ICD-10-CM

## 2020-12-14 DIAGNOSIS — M2041 Other hammer toe(s) (acquired), right foot: Secondary | ICD-10-CM

## 2020-12-14 NOTE — Progress Notes (Signed)
Subjective:   Patient ID: Christy Buck, female   DOB: 87 y.o.   MRN: [id number]   HPI Patient presents with digital deformities bilateral with keratotic lesion on the fourth digit right foot that become symptomatic and painful over the last month.  Does not remember injury or change in shoes   ROS      Objective:  Physical Exam  Neurovascular status intact with digital deformity keratotic lesion pain with pressure to the toe with moderate structural deformity     Assessment:  Hammertoe deformity digit 4 right with several other toes having digital deformity and pain     Plan:  H&P reviewed condition and went ahead today and I debrided lesion applied padding and discussed digital correction.  Do not recommend digital correction currently patient will be seen back to recheck encouraged to call questions concerns which may arise

## 2020-12-15 DIAGNOSIS — H402211 Chronic angle-closure glaucoma, right eye, mild stage: Secondary | ICD-10-CM | POA: Diagnosis not present

## 2020-12-15 DIAGNOSIS — H402222 Chronic angle-closure glaucoma, left eye, moderate stage: Secondary | ICD-10-CM | POA: Diagnosis not present

## 2021-02-16 DIAGNOSIS — R600 Localized edema: Secondary | ICD-10-CM | POA: Diagnosis not present

## 2021-02-16 DIAGNOSIS — I10 Essential (primary) hypertension: Secondary | ICD-10-CM | POA: Diagnosis not present

## 2021-02-23 DIAGNOSIS — R600 Localized edema: Secondary | ICD-10-CM | POA: Diagnosis not present

## 2021-02-23 DIAGNOSIS — Z23 Encounter for immunization: Secondary | ICD-10-CM | POA: Diagnosis not present

## 2021-02-23 DIAGNOSIS — I10 Essential (primary) hypertension: Secondary | ICD-10-CM | POA: Diagnosis not present

## 2021-04-09 DIAGNOSIS — Z23 Encounter for immunization: Secondary | ICD-10-CM | POA: Diagnosis not present

## 2021-04-20 DIAGNOSIS — I10 Essential (primary) hypertension: Secondary | ICD-10-CM | POA: Diagnosis not present

## 2021-04-20 DIAGNOSIS — E039 Hypothyroidism, unspecified: Secondary | ICD-10-CM | POA: Diagnosis not present

## 2021-04-27 DIAGNOSIS — N182 Chronic kidney disease, stage 2 (mild): Secondary | ICD-10-CM | POA: Diagnosis not present

## 2021-04-27 DIAGNOSIS — R600 Localized edema: Secondary | ICD-10-CM | POA: Diagnosis not present

## 2021-04-27 DIAGNOSIS — I10 Essential (primary) hypertension: Secondary | ICD-10-CM | POA: Diagnosis not present

## 2021-04-27 DIAGNOSIS — E039 Hypothyroidism, unspecified: Secondary | ICD-10-CM | POA: Diagnosis not present

## 2021-04-27 DIAGNOSIS — M150 Primary generalized (osteo)arthritis: Secondary | ICD-10-CM | POA: Diagnosis not present

## 2021-06-14 DIAGNOSIS — H25813 Combined forms of age-related cataract, bilateral: Secondary | ICD-10-CM | POA: Diagnosis not present

## 2021-06-14 DIAGNOSIS — H402211 Chronic angle-closure glaucoma, right eye, mild stage: Secondary | ICD-10-CM | POA: Diagnosis not present

## 2021-06-14 DIAGNOSIS — H402222 Chronic angle-closure glaucoma, left eye, moderate stage: Secondary | ICD-10-CM | POA: Diagnosis not present

## 2021-06-22 DIAGNOSIS — M150 Primary generalized (osteo)arthritis: Secondary | ICD-10-CM | POA: Diagnosis not present

## 2021-06-22 DIAGNOSIS — E039 Hypothyroidism, unspecified: Secondary | ICD-10-CM | POA: Diagnosis not present

## 2021-06-22 DIAGNOSIS — N182 Chronic kidney disease, stage 2 (mild): Secondary | ICD-10-CM | POA: Diagnosis not present

## 2021-06-22 DIAGNOSIS — I10 Essential (primary) hypertension: Secondary | ICD-10-CM | POA: Diagnosis not present

## 2021-08-19 DIAGNOSIS — M150 Primary generalized (osteo)arthritis: Secondary | ICD-10-CM | POA: Diagnosis not present

## 2021-08-19 DIAGNOSIS — E039 Hypothyroidism, unspecified: Secondary | ICD-10-CM | POA: Diagnosis not present

## 2021-08-19 DIAGNOSIS — N182 Chronic kidney disease, stage 2 (mild): Secondary | ICD-10-CM | POA: Diagnosis not present

## 2021-08-19 DIAGNOSIS — I10 Essential (primary) hypertension: Secondary | ICD-10-CM | POA: Diagnosis not present

## 2021-09-20 ENCOUNTER — Other Ambulatory Visit: Payer: Self-pay | Admitting: Internal Medicine

## 2021-09-20 DIAGNOSIS — Z1231 Encounter for screening mammogram for malignant neoplasm of breast: Secondary | ICD-10-CM

## 2021-10-07 DIAGNOSIS — Z0000 Encounter for general adult medical examination without abnormal findings: Secondary | ICD-10-CM | POA: Diagnosis not present

## 2021-10-07 DIAGNOSIS — I10 Essential (primary) hypertension: Secondary | ICD-10-CM | POA: Diagnosis not present

## 2021-10-07 DIAGNOSIS — M150 Primary generalized (osteo)arthritis: Secondary | ICD-10-CM | POA: Diagnosis not present

## 2021-10-07 DIAGNOSIS — E039 Hypothyroidism, unspecified: Secondary | ICD-10-CM | POA: Diagnosis not present

## 2021-10-07 DIAGNOSIS — I38 Endocarditis, valve unspecified: Secondary | ICD-10-CM | POA: Diagnosis not present

## 2021-10-07 DIAGNOSIS — R413 Other amnesia: Secondary | ICD-10-CM | POA: Diagnosis not present

## 2021-10-07 DIAGNOSIS — N182 Chronic kidney disease, stage 2 (mild): Secondary | ICD-10-CM | POA: Diagnosis not present

## 2021-10-27 ENCOUNTER — Ambulatory Visit: Payer: Medicare Other

## 2021-11-03 ENCOUNTER — Ambulatory Visit
Admission: RE | Admit: 2021-11-03 | Discharge: 2021-11-03 | Disposition: A | Discharge status: Home / Self Care | Payer: Medicare Other | Source: Ambulatory Visit | Attending: Internal Medicine | Admitting: Internal Medicine

## 2021-11-03 DIAGNOSIS — Z1231 Encounter for screening mammogram for malignant neoplasm of breast: Secondary | ICD-10-CM

## 2021-12-20 DIAGNOSIS — H402211 Chronic angle-closure glaucoma, right eye, mild stage: Secondary | ICD-10-CM | POA: Diagnosis not present

## 2021-12-20 DIAGNOSIS — H25813 Combined forms of age-related cataract, bilateral: Secondary | ICD-10-CM | POA: Diagnosis not present

## 2021-12-20 DIAGNOSIS — H402222 Chronic angle-closure glaucoma, left eye, moderate stage: Secondary | ICD-10-CM | POA: Diagnosis not present

## 2021-12-20 DIAGNOSIS — H04123 Dry eye syndrome of bilateral lacrimal glands: Secondary | ICD-10-CM | POA: Diagnosis not present

## 2022-03-17 DIAGNOSIS — I10 Essential (primary) hypertension: Secondary | ICD-10-CM | POA: Diagnosis not present

## 2022-03-17 DIAGNOSIS — M150 Primary generalized (osteo)arthritis: Secondary | ICD-10-CM | POA: Diagnosis not present

## 2022-03-17 DIAGNOSIS — Z23 Encounter for immunization: Secondary | ICD-10-CM | POA: Diagnosis not present

## 2022-03-17 DIAGNOSIS — N182 Chronic kidney disease, stage 2 (mild): Secondary | ICD-10-CM | POA: Diagnosis not present

## 2022-03-17 DIAGNOSIS — R413 Other amnesia: Secondary | ICD-10-CM | POA: Diagnosis not present

## 2022-03-17 DIAGNOSIS — E039 Hypothyroidism, unspecified: Secondary | ICD-10-CM | POA: Diagnosis not present

## 2022-03-17 DIAGNOSIS — I38 Endocarditis, valve unspecified: Secondary | ICD-10-CM | POA: Diagnosis not present

## 2022-03-31 DIAGNOSIS — I10 Essential (primary) hypertension: Secondary | ICD-10-CM | POA: Diagnosis not present

## 2022-03-31 DIAGNOSIS — E039 Hypothyroidism, unspecified: Secondary | ICD-10-CM | POA: Diagnosis not present

## 2022-03-31 DIAGNOSIS — N182 Chronic kidney disease, stage 2 (mild): Secondary | ICD-10-CM | POA: Diagnosis not present

## 2022-03-31 DIAGNOSIS — M150 Primary generalized (osteo)arthritis: Secondary | ICD-10-CM | POA: Diagnosis not present

## 2022-03-31 DIAGNOSIS — R413 Other amnesia: Secondary | ICD-10-CM | POA: Diagnosis not present

## 2022-03-31 DIAGNOSIS — I38 Endocarditis, valve unspecified: Secondary | ICD-10-CM | POA: Diagnosis not present

## 2022-04-14 DIAGNOSIS — N182 Chronic kidney disease, stage 2 (mild): Secondary | ICD-10-CM | POA: Diagnosis not present

## 2022-04-14 DIAGNOSIS — I10 Essential (primary) hypertension: Secondary | ICD-10-CM | POA: Diagnosis not present

## 2022-04-14 DIAGNOSIS — E039 Hypothyroidism, unspecified: Secondary | ICD-10-CM | POA: Diagnosis not present

## 2022-04-21 DIAGNOSIS — I38 Endocarditis, valve unspecified: Secondary | ICD-10-CM | POA: Diagnosis not present

## 2022-04-21 DIAGNOSIS — E871 Hypo-osmolality and hyponatremia: Secondary | ICD-10-CM | POA: Diagnosis not present

## 2022-04-21 DIAGNOSIS — N182 Chronic kidney disease, stage 2 (mild): Secondary | ICD-10-CM | POA: Diagnosis not present

## 2022-04-21 DIAGNOSIS — E039 Hypothyroidism, unspecified: Secondary | ICD-10-CM | POA: Diagnosis not present

## 2022-04-21 DIAGNOSIS — Z23 Encounter for immunization: Secondary | ICD-10-CM | POA: Diagnosis not present

## 2022-04-21 DIAGNOSIS — I10 Essential (primary) hypertension: Secondary | ICD-10-CM | POA: Diagnosis not present

## 2022-04-21 DIAGNOSIS — R413 Other amnesia: Secondary | ICD-10-CM | POA: Diagnosis not present

## 2022-04-21 DIAGNOSIS — M150 Primary generalized (osteo)arthritis: Secondary | ICD-10-CM | POA: Diagnosis not present

## 2022-05-19 DIAGNOSIS — I10 Essential (primary) hypertension: Secondary | ICD-10-CM | POA: Diagnosis not present

## 2022-07-04 DIAGNOSIS — H25813 Combined forms of age-related cataract, bilateral: Secondary | ICD-10-CM | POA: Diagnosis not present

## 2022-07-04 DIAGNOSIS — H402222 Chronic angle-closure glaucoma, left eye, moderate stage: Secondary | ICD-10-CM | POA: Diagnosis not present

## 2022-07-04 DIAGNOSIS — H402211 Chronic angle-closure glaucoma, right eye, mild stage: Secondary | ICD-10-CM | POA: Diagnosis not present

## 2022-07-21 DIAGNOSIS — R413 Other amnesia: Secondary | ICD-10-CM | POA: Diagnosis not present

## 2022-07-21 DIAGNOSIS — I129 Hypertensive chronic kidney disease with stage 1 through stage 4 chronic kidney disease, or unspecified chronic kidney disease: Secondary | ICD-10-CM | POA: Diagnosis not present

## 2022-07-21 DIAGNOSIS — I10 Essential (primary) hypertension: Secondary | ICD-10-CM | POA: Diagnosis not present

## 2022-07-21 DIAGNOSIS — I38 Endocarditis, valve unspecified: Secondary | ICD-10-CM | POA: Diagnosis not present

## 2022-07-21 DIAGNOSIS — E039 Hypothyroidism, unspecified: Secondary | ICD-10-CM | POA: Diagnosis not present

## 2022-07-21 DIAGNOSIS — M150 Primary generalized (osteo)arthritis: Secondary | ICD-10-CM | POA: Diagnosis not present

## 2022-07-21 DIAGNOSIS — N182 Chronic kidney disease, stage 2 (mild): Secondary | ICD-10-CM | POA: Diagnosis not present

## 2022-09-26 DIAGNOSIS — H25812 Combined forms of age-related cataract, left eye: Secondary | ICD-10-CM | POA: Diagnosis not present

## 2022-09-26 DIAGNOSIS — H402222 Chronic angle-closure glaucoma, left eye, moderate stage: Secondary | ICD-10-CM | POA: Diagnosis not present

## 2022-09-26 DIAGNOSIS — H25813 Combined forms of age-related cataract, bilateral: Secondary | ICD-10-CM | POA: Diagnosis not present

## 2022-09-26 DIAGNOSIS — H524 Presbyopia: Secondary | ICD-10-CM | POA: Diagnosis not present

## 2022-09-26 DIAGNOSIS — H402211 Chronic angle-closure glaucoma, right eye, mild stage: Secondary | ICD-10-CM | POA: Diagnosis not present

## 2022-10-05 DIAGNOSIS — H268 Other specified cataract: Secondary | ICD-10-CM | POA: Diagnosis not present

## 2022-10-05 DIAGNOSIS — H25812 Combined forms of age-related cataract, left eye: Secondary | ICD-10-CM | POA: Diagnosis not present

## 2022-11-01 DIAGNOSIS — I10 Essential (primary) hypertension: Secondary | ICD-10-CM | POA: Diagnosis not present

## 2022-11-01 DIAGNOSIS — N182 Chronic kidney disease, stage 2 (mild): Secondary | ICD-10-CM | POA: Diagnosis not present

## 2022-11-01 DIAGNOSIS — E039 Hypothyroidism, unspecified: Secondary | ICD-10-CM | POA: Diagnosis not present

## 2022-11-08 DIAGNOSIS — I38 Endocarditis, valve unspecified: Secondary | ICD-10-CM | POA: Diagnosis not present

## 2022-11-08 DIAGNOSIS — R001 Bradycardia, unspecified: Secondary | ICD-10-CM | POA: Diagnosis not present

## 2022-11-08 DIAGNOSIS — M150 Primary generalized (osteo)arthritis: Secondary | ICD-10-CM | POA: Diagnosis not present

## 2022-11-08 DIAGNOSIS — Z0000 Encounter for general adult medical examination without abnormal findings: Secondary | ICD-10-CM | POA: Diagnosis not present

## 2022-11-08 DIAGNOSIS — N182 Chronic kidney disease, stage 2 (mild): Secondary | ICD-10-CM | POA: Diagnosis not present

## 2022-11-08 DIAGNOSIS — I10 Essential (primary) hypertension: Secondary | ICD-10-CM | POA: Diagnosis not present

## 2022-11-08 DIAGNOSIS — E039 Hypothyroidism, unspecified: Secondary | ICD-10-CM | POA: Diagnosis not present

## 2022-11-08 DIAGNOSIS — R413 Other amnesia: Secondary | ICD-10-CM | POA: Diagnosis not present

## 2022-11-14 ENCOUNTER — Other Ambulatory Visit: Payer: Self-pay | Admitting: Internal Medicine

## 2022-11-14 DIAGNOSIS — H25811 Combined forms of age-related cataract, right eye: Secondary | ICD-10-CM | POA: Diagnosis not present

## 2022-11-14 DIAGNOSIS — Z1231 Encounter for screening mammogram for malignant neoplasm of breast: Secondary | ICD-10-CM

## 2022-11-16 ENCOUNTER — Ambulatory Visit
Admission: RE | Admit: 2022-11-16 | Discharge: 2022-11-16 | Disposition: A | Discharge status: Home / Self Care | Payer: Medicare Other | Source: Ambulatory Visit | Attending: Internal Medicine | Admitting: Internal Medicine

## 2022-11-16 DIAGNOSIS — Z1231 Encounter for screening mammogram for malignant neoplasm of breast: Secondary | ICD-10-CM

## 2022-11-22 DIAGNOSIS — I10 Essential (primary) hypertension: Secondary | ICD-10-CM | POA: Diagnosis not present

## 2022-11-22 DIAGNOSIS — R413 Other amnesia: Secondary | ICD-10-CM | POA: Diagnosis not present

## 2022-11-22 DIAGNOSIS — E039 Hypothyroidism, unspecified: Secondary | ICD-10-CM | POA: Diagnosis not present

## 2022-11-22 DIAGNOSIS — R001 Bradycardia, unspecified: Secondary | ICD-10-CM | POA: Diagnosis not present

## 2022-11-22 DIAGNOSIS — N182 Chronic kidney disease, stage 2 (mild): Secondary | ICD-10-CM | POA: Diagnosis not present

## 2022-12-23 DIAGNOSIS — H0011 Chalazion right upper eyelid: Secondary | ICD-10-CM | POA: Diagnosis not present

## 2023-01-06 ENCOUNTER — Ambulatory Visit: Payer: Medicare Other | Attending: Cardiovascular Disease | Admitting: Cardiovascular Disease

## 2023-01-06 ENCOUNTER — Encounter: Payer: Self-pay | Admitting: Cardiovascular Disease

## 2023-01-06 VITALS — BP 132/86 | HR 59 | Ht 62.0 in | Wt 119.4 lb

## 2023-01-06 DIAGNOSIS — E039 Hypothyroidism, unspecified: Secondary | ICD-10-CM

## 2023-01-06 DIAGNOSIS — T50905A Adverse effect of unspecified drugs, medicaments and biological substances, initial encounter: Secondary | ICD-10-CM | POA: Diagnosis not present

## 2023-01-06 DIAGNOSIS — I10 Essential (primary) hypertension: Secondary | ICD-10-CM | POA: Diagnosis not present

## 2023-01-06 DIAGNOSIS — I440 Atrioventricular block, first degree: Secondary | ICD-10-CM | POA: Diagnosis not present

## 2023-01-06 DIAGNOSIS — I444 Left anterior fascicular block: Secondary | ICD-10-CM | POA: Diagnosis not present

## 2023-01-06 DIAGNOSIS — R001 Bradycardia, unspecified: Secondary | ICD-10-CM | POA: Insufficient documentation

## 2023-01-06 NOTE — Patient Instructions (Signed)
Medication Instructions:  No changes *If you need a refill on your cardiac medications before your next appointment, please call your pharmacy*  Follow-Up: At [facility], you and your health needs are our priority.  As part of our continuing mission to provide you with exceptional heart care, we have created designated Provider Care Teams.  These Care Teams include your primary Cardiologist (physician) and Advanced Practice Providers (APPs -  Physician Assistants and Nurse Practitioners) who all work together to provide you with the care you need, when you need it.  We recommend signing up for the patient portal called "MyChart".  Sign up information is provided on this After Visit Summary.  MyChart is used to connect with patients for Virtual Visits (Telemedicine).  Patients are able to view lab/test results, encounter notes, upcoming appointments, etc.  Non-urgent messages can be sent to your provider as well.   To learn more about what you can do with MyChart, go to [email or web address].    Your next appointment:    Follow up as needed  Provider:   Dr Sallyanne Kuster

## 2023-01-06 NOTE — Progress Notes (Signed)
Cardiology Office Note:  .   Date:  01/06/2023  ID:  Christy Buck, DOB 08-May-1929, MRN [id number] PCP: Georgianne Fick, MD  [facility] Providers Cardiologist:  None    History of Present Illness: .   Christy Buck is a 89 y.o. female with longstanding hypertension and treated hypothyroidism referred by Dr. Nicholos Johns in consultation for bradycardia.  She was incidentally found to have significant bradycardia with a heart rate of 46 bpm at the time of her recent evaluation in the office.  She had no complaints of dizziness, fatigue or syncope.  Her nebivolol 2.5 mg daily prescription was stopped.  She feels great.  She worked in the yard yesterday for couple of hours, without any complaints.  The patient specifically denies any chest pain at rest exertion, dyspnea at rest or with exertion, orthopnea, paroxysmal nocturnal dyspnea, syncope, palpitations, focal neurological deficits, intermittent claudication, lower extremity edema, unexplained weight gain, cough, hemoptysis or wheezing.  ECG today shows borderline sinus bradycardia 59 bpm with first-degree AV block (PR interval 288 ms) and left anterior fascicular block (both the first-degree AV block and a left anterior fascicular block have been present at least since 2015).  ROS:   Studies Reviewed: Marland Kitchen   EKG Interpretation Date/Time:  Friday January 06 2023 10:29:17 EDT Ventricular Rate:  59 PR Interval:  288 QRS Duration:  70 QT Interval:  378 QTC Calculation: 374 R Axis:   -49  Text Interpretation: Sinus bradycardia with 1st degree A-V block Left axis deviation Low voltage QRS Left anterior fasicular block Cannot rule out Anteroseptal infarct (cited on or before 28-Oct-2013) When compared with ECG of 28-Oct-2013 14:20, Premature ventricular complexes are no longer Present Premature supraventricular complexes are no longer Present Confirmed by ,  ([id number]) on 01/06/2023 10:48:11 AM    Labs from PCP office  11/01/2022 Hemoglobin 14.5, creatinine 0.98, potassium 5.2, hemoglobin A1c 6.1% Cholesterol 208, HDL 72, LDL 122, triglycerides 79 TSH 1.970 Risk Assessment/Calculations:             Physical Exam:   VS:  BP 132/86 (BP Location: Left Arm, Patient Position: Sitting, Cuff Size: Small)   Pulse (!) 59   Ht 5\' 2"  (1.575 m)   Wt 119 lb 6.4 oz (54.2 kg)   SpO2 98%   BMI 21.84 kg/m    Wt Readings from Last 3 Encounters:  01/06/23 119 lb 6.4 oz (54.2 kg)  10/28/13 141 lb (64 kg)    GEN: Well nourished, well developed in no acute distress NECK: No JVD; No carotid bruits CARDIAC: RRR, short systolic ejection murmur heard to the right of the sternal border, no diastolic murmurs, rubs, gallops RESPIRATORY:  Clear to auscultation without rales, wheezing or rhonchi  ABDOMEN: Soft, non-tender, non-distended EXTREMITIES:  No edema; No deformity   ASSESSMENT AND PLAN: .   Bradycardia: Improved after discontinuation of the beta-blocker.  She still has significant PR interval prolongation and a left anterior fascicular block, but she is completely asymptomatic.  It is possible that she may require pacemaker therapy in the future, but this is currently not indicated.  Avoid beta-blockers, diltiazem, verapamil and other medications with negative chronotropic effect.  Asked her to call us back if she develops extreme fatigue, syncope, dizziness or other symptoms of bradycardia.  There is some mention of memory impairment.  If she ends up requiring cholinesterase inhibitor such as Aricept and Namenda, bradycardia may become an issue once more. HTN: After the changes that Dr. Nicholos Johns performed ,  her bradycardia has resolved and her blood pressure remains well-controlled.  No additional changes recommended. Hypothyroidism: Clinically euthyroid, normal recent TSH on current levothyroxine dose.       Dispo: Continue current medications.  Follow-up as needed.  Please call us if you develop loss of  consciousness, dizziness, fatigue or other symptoms of a slow heart rate.  Signed, Thurmon Fair, MD

## 2023-02-08 DIAGNOSIS — H0011 Chalazion right upper eyelid: Secondary | ICD-10-CM | POA: Diagnosis not present

## 2023-02-15 DIAGNOSIS — R001 Bradycardia, unspecified: Secondary | ICD-10-CM | POA: Diagnosis not present

## 2023-02-15 DIAGNOSIS — N182 Chronic kidney disease, stage 2 (mild): Secondary | ICD-10-CM | POA: Diagnosis not present

## 2023-02-15 DIAGNOSIS — I10 Essential (primary) hypertension: Secondary | ICD-10-CM | POA: Diagnosis not present

## 2023-02-15 DIAGNOSIS — E039 Hypothyroidism, unspecified: Secondary | ICD-10-CM | POA: Diagnosis not present

## 2023-02-15 DIAGNOSIS — R413 Other amnesia: Secondary | ICD-10-CM | POA: Diagnosis not present

## 2023-02-22 DIAGNOSIS — Z23 Encounter for immunization: Secondary | ICD-10-CM | POA: Diagnosis not present

## 2023-02-22 DIAGNOSIS — N1831 Chronic kidney disease, stage 3a: Secondary | ICD-10-CM | POA: Diagnosis not present

## 2023-02-22 DIAGNOSIS — R001 Bradycardia, unspecified: Secondary | ICD-10-CM | POA: Diagnosis not present

## 2023-02-28 DIAGNOSIS — H268 Other specified cataract: Secondary | ICD-10-CM | POA: Diagnosis not present

## 2023-02-28 DIAGNOSIS — H25811 Combined forms of age-related cataract, right eye: Secondary | ICD-10-CM | POA: Diagnosis not present

## 2023-04-19 DIAGNOSIS — N1831 Chronic kidney disease, stage 3a: Secondary | ICD-10-CM | POA: Diagnosis not present

## 2023-05-03 DIAGNOSIS — E039 Hypothyroidism, unspecified: Secondary | ICD-10-CM | POA: Diagnosis not present

## 2023-05-03 DIAGNOSIS — I10 Essential (primary) hypertension: Secondary | ICD-10-CM | POA: Diagnosis not present

## 2023-06-07 IMAGING — MG MM DIGITAL SCREENING BILAT W/ TOMO AND CAD
8 series · 9 of 24 positions shown · non-contrast
Comparison: Previous exam(s).

CLINICAL DATA: Screening.

EXAM:
DIGITAL SCREENING BILATERAL MAMMOGRAM WITH TOMOSYNTHESIS AND CAD
TECHNIQUE: Bilateral screening digital craniocaudal and mediolateral oblique
mammograms were obtained. Bilateral screening digital breast
tomosynthesis was performed. The images were evaluated with
computer-aided detection.

[R CC synth-2D]
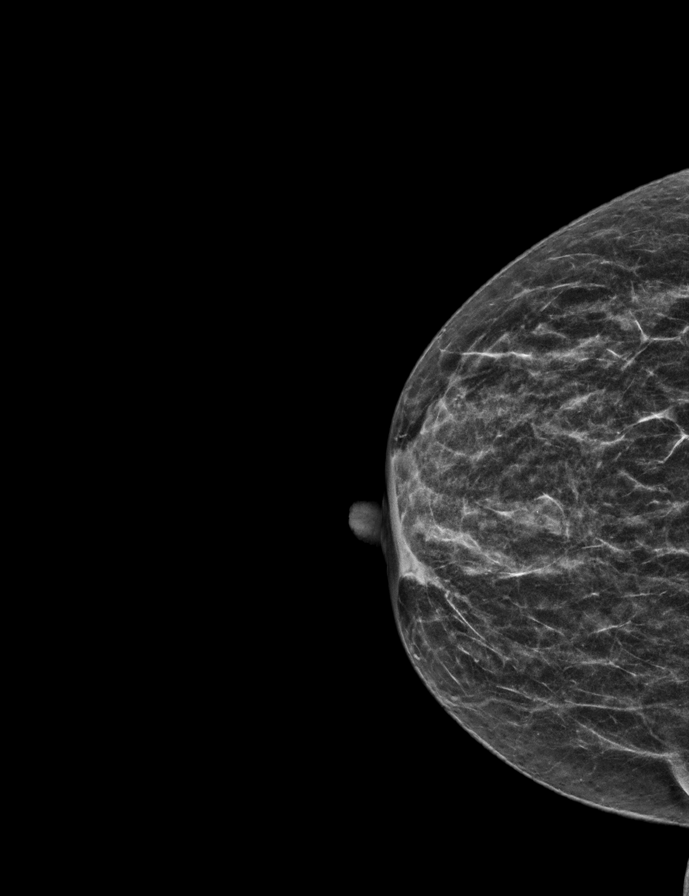

[R MLO synth-2D]
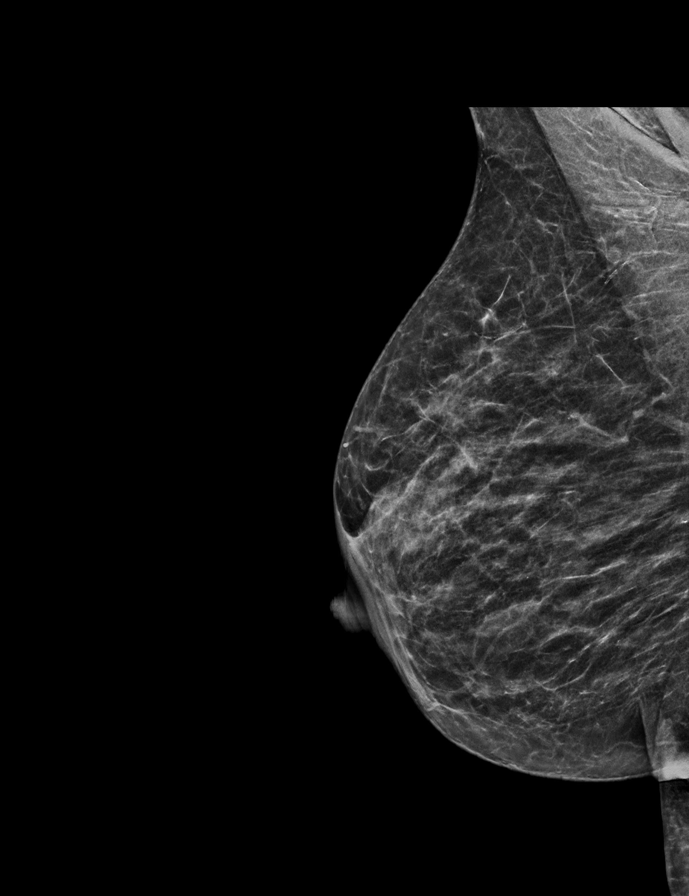

[L CC synth-2D]
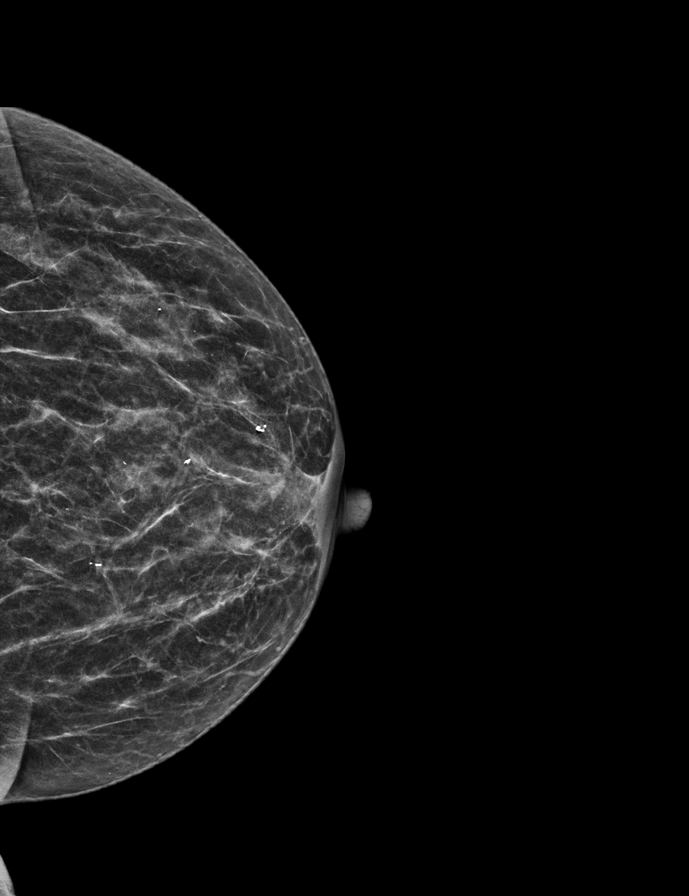

[L MLO synth-2D]
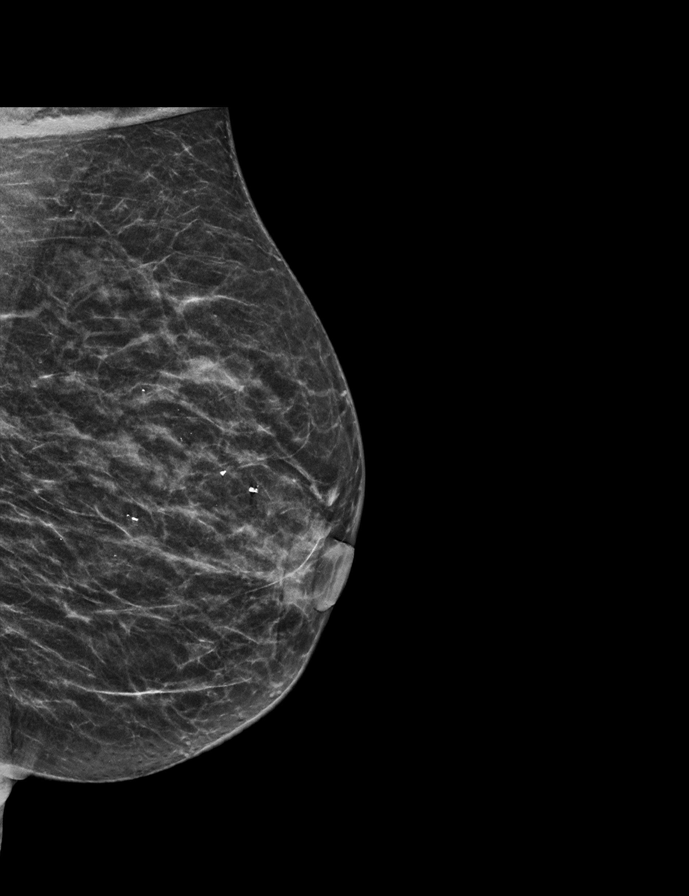

[L MLO tomo · 2 of 44 frames shown]
[frame 15/44]
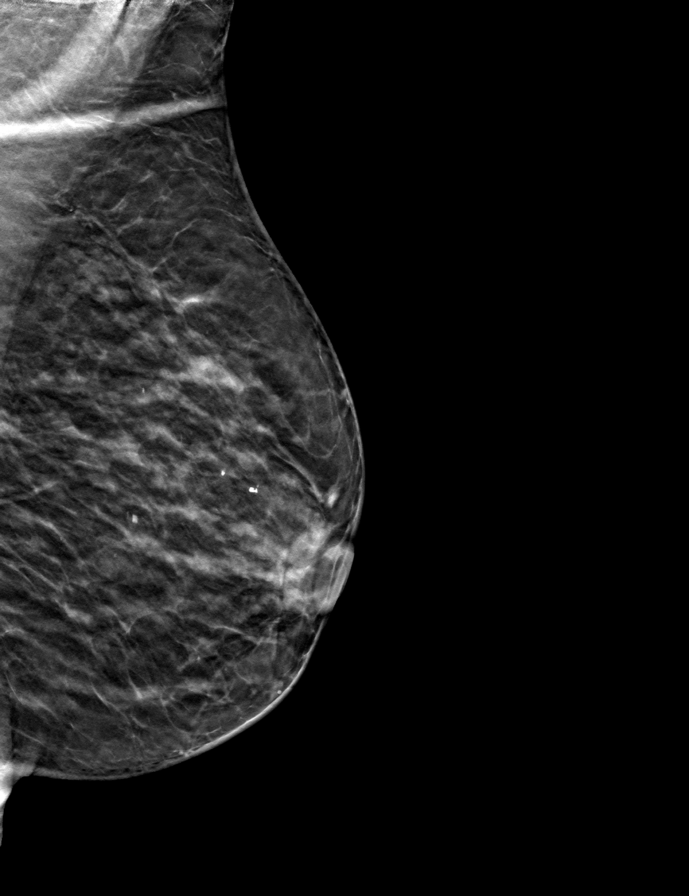
[frame 23/44]
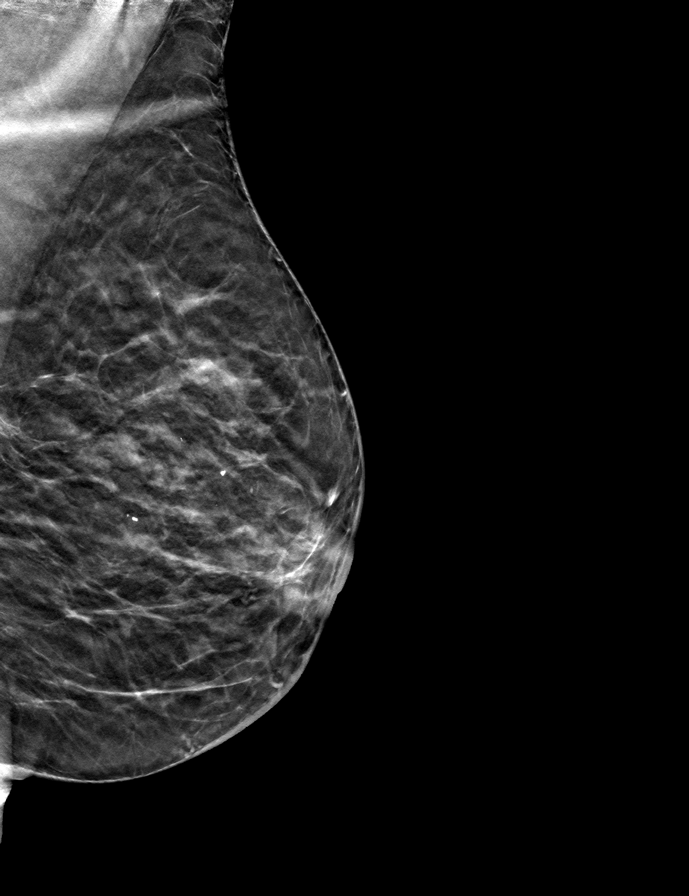

[R CC tomo · tomo slice 21/41.0]
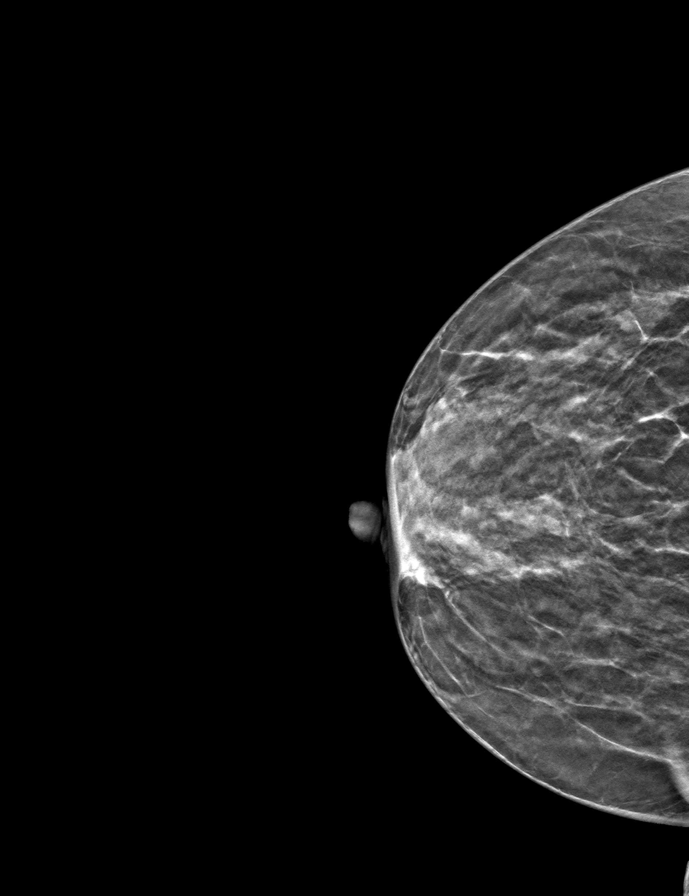

[L CC tomo · tomo slice 21/42.0]
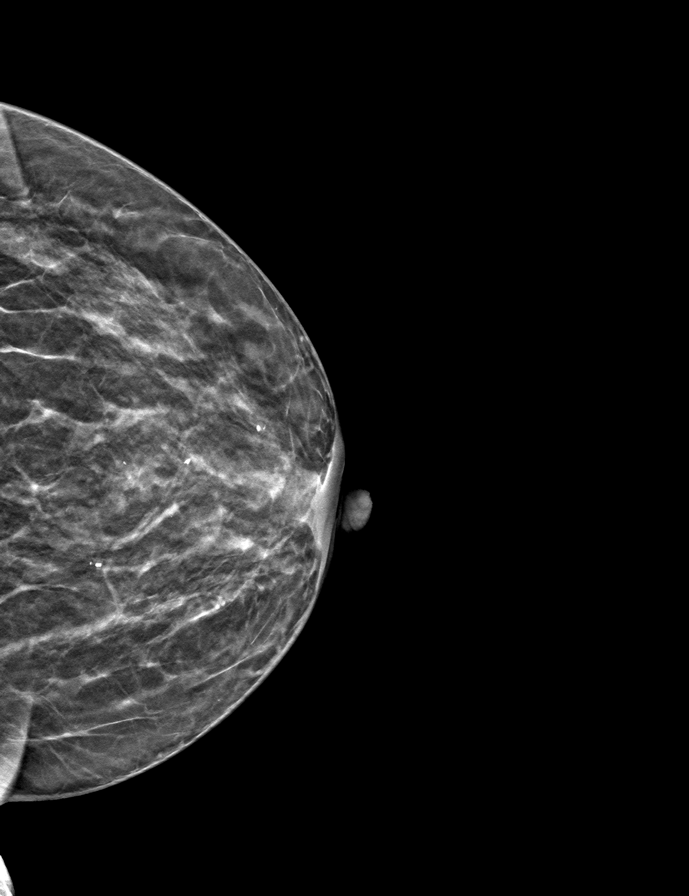

[R MLO tomo · tomo slice 23/46.0]
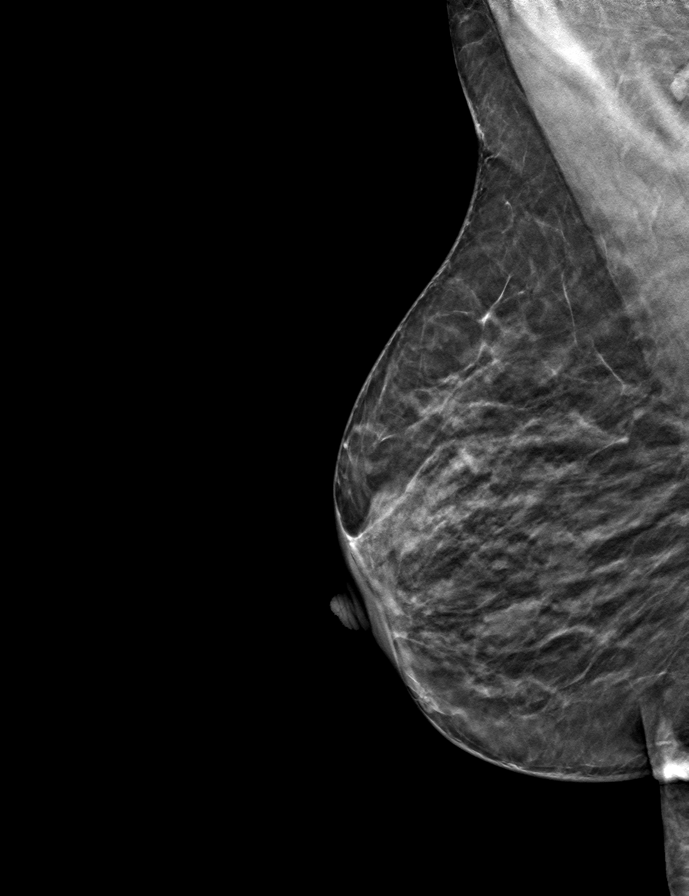

[9 of 24 positions shown; findings below may reference images not displayed]

ACR Breast Density Category b: There are scattered areas of
fibroglandular density.
FINDINGS: There are no findings suspicious for malignancy.
IMPRESSION: No mammographic evidence of malignancy. A result letter of this
screening mammogram will be mailed directly to the patient.

RECOMMENDATION:
Screening mammogram in one year. (Code:51-O-LD2)

BI-RADS CATEGORY  1: Negative.

## 2023-07-28 DIAGNOSIS — I10 Essential (primary) hypertension: Secondary | ICD-10-CM | POA: Diagnosis not present

## 2023-07-28 DIAGNOSIS — E039 Hypothyroidism, unspecified: Secondary | ICD-10-CM | POA: Diagnosis not present

## 2023-07-31 DIAGNOSIS — H402222 Chronic angle-closure glaucoma, left eye, moderate stage: Secondary | ICD-10-CM | POA: Diagnosis not present

## 2023-07-31 DIAGNOSIS — H402211 Chronic angle-closure glaucoma, right eye, mild stage: Secondary | ICD-10-CM | POA: Diagnosis not present

## 2023-08-04 DIAGNOSIS — R413 Other amnesia: Secondary | ICD-10-CM | POA: Diagnosis not present

## 2023-08-04 DIAGNOSIS — E039 Hypothyroidism, unspecified: Secondary | ICD-10-CM | POA: Diagnosis not present

## 2023-08-04 DIAGNOSIS — I38 Endocarditis, valve unspecified: Secondary | ICD-10-CM | POA: Diagnosis not present

## 2023-08-04 DIAGNOSIS — I10 Essential (primary) hypertension: Secondary | ICD-10-CM | POA: Diagnosis not present

## 2023-08-04 DIAGNOSIS — M150 Primary generalized (osteo)arthritis: Secondary | ICD-10-CM | POA: Diagnosis not present

## 2023-08-04 DIAGNOSIS — N182 Chronic kidney disease, stage 2 (mild): Secondary | ICD-10-CM | POA: Diagnosis not present

## 2023-08-25 ENCOUNTER — Encounter: Payer: Self-pay | Admitting: Podiatry

## 2023-08-25 ENCOUNTER — Ambulatory Visit (INDEPENDENT_AMBULATORY_CARE_PROVIDER_SITE_OTHER): Admitting: Podiatry

## 2023-08-25 ENCOUNTER — Ambulatory Visit (INDEPENDENT_AMBULATORY_CARE_PROVIDER_SITE_OTHER)

## 2023-08-25 DIAGNOSIS — M7751 Other enthesopathy of right foot: Secondary | ICD-10-CM | POA: Diagnosis not present

## 2023-08-25 DIAGNOSIS — M79671 Pain in right foot: Secondary | ICD-10-CM | POA: Diagnosis not present

## 2023-08-25 MED ORDER — TRIAMCINOLONE ACETONIDE 10 MG/ML IJ SUSP
10.0000 mg | Freq: Once | INTRAMUSCULAR | Status: AC
  Administered 2023-08-25: 10 mg via INTRA_ARTICULAR

## 2023-08-27 NOTE — Progress Notes (Signed)
 Subjective:   Patient ID: Christy Buck, female   DOB: 89 y.o.   MRN: [id number]   HPI Patient states she is getting a lot more pain around the bunion site right and not sure if she might not need surgery   ROS      Objective:  Physical Exam  Neurovascular status unchanged with large hyperostosis medial aspect first metatarsal head right over the left with fluid buildup around the joint     Assessment:  Inflammatory capsulitis with structural bunion deformity right     Plan:  H&P reviewed sterile prep injected around the first MPJ right 3 mg dexamethasone Kenalog 5 mg Xylocaine and advised on wider shoes and soaks.  Do not think she is a good candidate at this time for surgery.  X-rays do indicate significant structural bunion moderate osteoporosis no other pathology

## 2023-10-17 ENCOUNTER — Other Ambulatory Visit: Payer: Self-pay | Admitting: Internal Medicine

## 2023-10-17 DIAGNOSIS — Z1231 Encounter for screening mammogram for malignant neoplasm of breast: Secondary | ICD-10-CM

## 2023-11-17 ENCOUNTER — Ambulatory Visit
Admission: RE | Admit: 2023-11-17 | Discharge: 2023-11-17 | Disposition: A | Discharge status: Home / Self Care | Source: Ambulatory Visit | Attending: Internal Medicine | Admitting: Internal Medicine

## 2023-11-17 DIAGNOSIS — Z1231 Encounter for screening mammogram for malignant neoplasm of breast: Secondary | ICD-10-CM

## 2023-11-30 DIAGNOSIS — N182 Chronic kidney disease, stage 2 (mild): Secondary | ICD-10-CM | POA: Diagnosis not present

## 2023-11-30 DIAGNOSIS — R001 Bradycardia, unspecified: Secondary | ICD-10-CM | POA: Diagnosis not present

## 2023-11-30 DIAGNOSIS — R413 Other amnesia: Secondary | ICD-10-CM | POA: Diagnosis not present

## 2023-11-30 DIAGNOSIS — I10 Essential (primary) hypertension: Secondary | ICD-10-CM | POA: Diagnosis not present

## 2023-11-30 DIAGNOSIS — I38 Endocarditis, valve unspecified: Secondary | ICD-10-CM | POA: Diagnosis not present

## 2023-11-30 DIAGNOSIS — E039 Hypothyroidism, unspecified: Secondary | ICD-10-CM | POA: Diagnosis not present

## 2023-11-30 DIAGNOSIS — Z0000 Encounter for general adult medical examination without abnormal findings: Secondary | ICD-10-CM | POA: Diagnosis not present

## 2023-11-30 DIAGNOSIS — M150 Primary generalized (osteo)arthritis: Secondary | ICD-10-CM | POA: Diagnosis not present

## 2024-04-03 DIAGNOSIS — H402211 Chronic angle-closure glaucoma, right eye, mild stage: Secondary | ICD-10-CM | POA: Diagnosis not present

## 2024-04-03 DIAGNOSIS — H402222 Chronic angle-closure glaucoma, left eye, moderate stage: Secondary | ICD-10-CM | POA: Diagnosis not present

## 2024-04-03 DIAGNOSIS — H35033 Hypertensive retinopathy, bilateral: Secondary | ICD-10-CM | POA: Diagnosis not present

## 2024-04-03 DIAGNOSIS — H04123 Dry eye syndrome of bilateral lacrimal glands: Secondary | ICD-10-CM | POA: Diagnosis not present

## 2024-04-03 DIAGNOSIS — Z23 Encounter for immunization: Secondary | ICD-10-CM | POA: Diagnosis not present
